# Patient Record
Sex: Female | Born: 1955 | Race: White | Hispanic: No | Marital: Married
Health system: Southern US, Community
[De-identification: ages and names within clinical notes are randomized; demographics above are authoritative.]

## PROBLEM LIST (undated history)

## (undated) DIAGNOSIS — J439 Emphysema, unspecified: Secondary | ICD-10-CM

## (undated) DIAGNOSIS — F419 Anxiety disorder, unspecified: Secondary | ICD-10-CM

## (undated) DIAGNOSIS — C801 Malignant (primary) neoplasm, unspecified: Secondary | ICD-10-CM

## (undated) DIAGNOSIS — E039 Hypothyroidism, unspecified: Secondary | ICD-10-CM

## (undated) DIAGNOSIS — J45909 Unspecified asthma, uncomplicated: Secondary | ICD-10-CM

## (undated) DIAGNOSIS — M1990 Unspecified osteoarthritis, unspecified site: Secondary | ICD-10-CM

## (undated) DIAGNOSIS — F32A Depression, unspecified: Secondary | ICD-10-CM

## (undated) DIAGNOSIS — R0600 Dyspnea, unspecified: Secondary | ICD-10-CM

## (undated) DIAGNOSIS — J449 Chronic obstructive pulmonary disease, unspecified: Secondary | ICD-10-CM

## (undated) HISTORY — PX: COLONOSCOPY: SHX174

## (undated) HISTORY — PX: BACK SURGERY: SHX140

## (undated) HISTORY — PX: CHOLECYSTECTOMY: SHX55

## (undated) HISTORY — PX: BREAST BIOPSY: SHX20

---

## 2000-12-27 ENCOUNTER — Other Ambulatory Visit: Admission: RE | Admit: 2000-12-27 | Discharge: 2000-12-27 | Payer: Self-pay | Admitting: Family Medicine

## 2000-12-27 ENCOUNTER — Ambulatory Visit (HOSPITAL_COMMUNITY): Admission: RE | Admit: 2000-12-27 | Discharge: 2000-12-27 | Payer: Self-pay | Admitting: Family Medicine

## 2000-12-27 ENCOUNTER — Encounter: Payer: Self-pay | Admitting: Family Medicine

## 2002-11-08 ENCOUNTER — Ambulatory Visit (HOSPITAL_COMMUNITY): Admission: RE | Admit: 2002-11-08 | Discharge: 2002-11-08 | Payer: Self-pay | Admitting: Family Medicine

## 2002-11-08 ENCOUNTER — Encounter: Payer: Self-pay | Admitting: Family Medicine

## 2004-04-29 ENCOUNTER — Ambulatory Visit (HOSPITAL_COMMUNITY): Admission: RE | Admit: 2004-04-29 | Discharge: 2004-04-29 | Payer: Self-pay | Admitting: Family Medicine

## 2005-09-07 ENCOUNTER — Ambulatory Visit (HOSPITAL_COMMUNITY): Admission: RE | Admit: 2005-09-07 | Discharge: 2005-09-07 | Payer: Self-pay | Admitting: Family Medicine

## 2007-11-08 ENCOUNTER — Ambulatory Visit (HOSPITAL_COMMUNITY): Admission: RE | Admit: 2007-11-08 | Discharge: 2007-11-08 | Payer: Self-pay | Admitting: Family Medicine

## 2007-12-31 ENCOUNTER — Inpatient Hospital Stay (HOSPITAL_COMMUNITY): Admission: EM | Admit: 2007-12-31 | Discharge: 2008-01-03 | Payer: Self-pay | Admitting: Emergency Medicine

## 2008-12-17 ENCOUNTER — Ambulatory Visit (HOSPITAL_COMMUNITY): Admission: RE | Admit: 2008-12-17 | Discharge: 2008-12-17 | Payer: Self-pay | Admitting: Family Medicine

## 2010-12-22 NOTE — Discharge Summary (Signed)
NAME:  [name] [name], [name] [name]              ACCOUNT NO.:  [id number]   MEDICAL RECORD NO.:  [id number]          PATIENT TYPE:  INP   LOCATION:  A338                          FACILITY:  [facility]   PHYSICIAN:  [name] Singh, DO    DATE OF BIRTH:  [birth year]   DATE OF ADMISSION:  12/31/2007  DATE OF DISCHARGE:  05/27/2009LH                               DISCHARGE SUMMARY   PRIMARY CARE PHYSICIAN:  [name] [name], M.D.   ADMISSION DIAGNOSES:  1. Acute exacerbation of chronic obstructive pulmonary disease.  2. Hypoxic respiratory failure.   DISCHARGE DIAGNOSES:  1. Acute exacerbation of chronic obstructive pulmonary disease, which      has resolved.  2. Bronchitis.  3. Hypothyroidism.   Her H and P was done by Dr. Sherle [name].  Please refer to that, but to  summarize, patient is a 55 year old woman, who presented to [facility]  Emergency Room with the chief complaint of shortness of breath over the  last two to three days.  She was admitted to the service of [facility]  and she was continued on Solu-Medrol IV every 6, as well as Levaquin.  She was placed on GI and DVT prophylaxis.  While she was here, a D-dimer  was also obtained, which ended up being elevated.  This was continued to  be followed.  There was no CT that was done.  However, it was determined  that, even though her D-dimer was elevated, it was of unclear  significance with a low probability of its being a PE.  Also, she was  currently on HCTZ, but due to her blood pressure being low, that was  discontinued while she was here.  Patient continued to improve  significantly and it was determined on 05/26, that she would be  discharged in the morning, as long as her CBC was improving.  Today, her  discharge labs, are as follows.   Her vitals today are within normal limits.  However, there is mentioned  tachycardia, which is possibly due to her nebulizer treatments that she  has been receiving around the clock.  Her CBC:  White count  is 18.6,  which is down from 26.3.  Her hemoglobin is 11.5, hematocrit is 33.5 and  her platelet count is 395.  Her sodium is 139, potassium 3.6, chloride  103, CO2 24, glucose 109, BUN 21 and creatinine 1.21.   CONDITION:  Stable.   DISPOSITION:  Home.  She will be placed on her home meds, which include:   1. Prozac 40 mg p.o. daily.  2. Cytomel 25 mcg daily.  3. Synthroid 0.50 mcg p.o. daily.  4. Aldactazide 25 mg p.o. daily.  5. Simvastatin 20 mg p.o. q.h.s.  6. Xanax 0.5 mg every 6 hours as needed.  7. Ventolin MDI as needed every 4-6 hours.  8. We will give her a prescription for Advair 250/50 use every 12.  9. Medrol Dosepak use as directed.  10.Levaquin 500 mg p.o. daily times 5 days.  11.Xopenex 1.25 mg nebs every 4 hours p.r.n.   We will set patient up with a hand nebulizer and  we will have her  increase activity slowly to have no dietary restrictions, to see her  primary care physician, Dr. Regino [name], in one to three days, initial  appointment.  We will have home health come give her the nebulizer and  she will see her new medications as listed.      [name] Singh, DO  Electronically Signed     CB/MEDQ  D:  01/03/2008  T:  01/03/2008  Job:  [id number]   cc:   [name] [name], M.D.  Fax: [phone]

## 2010-12-22 NOTE — H&P (Signed)
NAME:  [name] [name]              ACCOUNT NO.:  [id number]   MEDICAL RECORD NO.:  [id number]          PATIENT TYPE:  INP   LOCATION:  A338                          FACILITY:  [facility]   PHYSICIAN:  Margaretmary Dys, M.D.DATE OF BIRTH:  1955-11-02   DATE OF ADMISSION:  12/31/2007  DATE OF DISCHARGE:  LH                              HISTORY & PHYSICAL   PRIMARY CARE PHYSICIAN:  Dr. Regino Schultze.   CHIEF COMPLAINT:  Increasing shortness of breath over the last 2 to 3  days.   HISTORY OF PRESENT ILLNESS:  [name] [name] is a 55 year old female who  has a history of chronic obstructive pulmonary disease.  Patient reports  feeling fairly well until about 2 to 3 days ago when she developed some  cough productive of mostly whitish sputum, some shortness of breath and  headaches.   Patient reports that there are a couple of people who are sick at her  workplace and she suspects that she may have picked up a virus from  them.  She denies any fevers or chills, no headaches, no nausea or  vomiting, no abdominal pain.  Patient reports occasional wheezing.  Patient was evaluated in the emergency room and although improved  slightly with nebulization treatment and Solu-Medrol patient continued  to wheeze and also was hypoxic.  Her oxygen saturation was only 86% on  room air.  As a result, patient was admitted for further evaluation and  management.  Currently, patient still feels short of breath but feels  slightly better, she denies any chest pain.  Patient has received  multiple nebulization treatments.  I think patient will benefit from  BiPAP therapy.   REVIEW OF SYSTEMS:  Her 10-point review of systems otherwise negative  except as mentioned in the History of Present Illness above.   PAST MEDICAL HISTORY:  1. Chronic obstructive pulmonary disease.  2. Depression.  3. Hypothyroidism.  4. Status post cholecystectomy.  5. Recent cardiac stress test which patient is yet to get the results  of.  6. Prior history of nicotine abuse, patient quit in February of 2009.   ALLERGIES:  She has no known drug allergies.   MEDICATIONS:  A full list is pending, but patient reports that she is  on:  1. Prozac once a day.  2. Synthroid once a day.  3. Xanax at night.  4. Advair Diskus 250/50, one puff inhaled b.i.d.  5. Atrovent two puffs inhalation q.i.d.   FAMILY HISTORY:  Positive for prostate cancer in father and diabetes in  the father.  Father died at age 24 from complications of diabetes and  renal failure.  Her mother is 69 years old and has some breathing  problems.  She is not exactly sure what her diagnosis is.  She has 4  other siblings who are doing fairly well.   SOCIAL HISTORY:  Patient is married, has 1 daughter who is 53 years old;  she currently works in Clinical research associate.  She quit smoking, after close to a  30-pack-year history of smoking, in February of 2009.  This was her  third attempt at  quitting.  She denies any illicit drug use.  She does  drink an occasional beer.   PHYSICAL EXAM:  Patient was conscious, alert, in mild respiratory  distress.  VITAL SIGNS:  Blood pressure was 118/72 with a pulse of 100, respiration  is 26, temperature 99.1 degrees Fahrenheit, oxygen saturation was 86% on  room air.  However, this has since improved to 97% on 4L oxygen.  HEENT EXAM:  Normocephalic, atraumatic, oral mucosa was moist with no  exudates.  NECK:  Supple, no JVD, no lymphadenopathy.  LUNGS:  Reduced air entry bilaterally with occasional bilateral wheezing  and rhonchi.  ABDOMEN:  Soft, nontender, bowel sounds positive, no masses palpable.  EXTREMITIES:  No pitting, pedal edema, no calf induration or tenderness  was noted.  Patient had multiple tattoos on her lower extremities and  back.  CNS EXAM:  Grossly intact, no focal neurological deficits.   LABORATORY/DIAGNOSTIC DATA:  Blood gas showed a pH of 7.375, pCO2 was  33.4, pO2 was 62.6, his oxygen saturation was  89.7%.  White blood cell  count was 11.8, hemoglobin of 12.7, hematocrit 37.7, platelet count was  389 with 96% neutrophils.  Sodium is 137, potassium 3.4, chloride of  102, CO2 was 23, glucose of 203, BUN of 17, creatinine was 1.18, calcium  was 10.1.  I have requested for a D-dimer and results of which are  pending.  Chest x-ray was clear with no acute infiltrates, the lungs may  be slightly hyperinflated.   ASSESSMENT AND PLAN:  1. Acute chronic obstructive pulmonary disease exacerbation.  2. Hypoxic respiratory failure.   PLAN:  1. Patient will be admitted to the medical floor.  I will continue      therapy with Solu-Medrol 60 mg IV q.6.  2. Patient will be continued on Levaquin 500 mg IV once a day.  3. GI prophylaxis with Protonix, DVT prophylaxis with Lovenox.  4. Resume all her home medications.  5. Due to patient's fair amount of hypoxia and no prior history of      oxygen requirement for her COPD and also the fact that this appears      to be quite acute, will request a D-dimer to rule out the      possibility of pulmonary embolus on her, although I still feel that      the COPD exacerbation is more likely at this time.   DISPOSITION:  I have discussed the above plan with the patient, she  verbalized full understanding.  The patient will also be maintained on  nebulizations around the clock, I will also put her on some BiPAP  therapy in order to help her work of breathing.   CODE STATUS:  Patient is a Full Code.      Margaretmary Dys, M.D.  Electronically Signed     AM/MEDQ  D:  12/31/2007  T:  12/31/2007  Job:  [id number]   cc:   Kirk Ruths, M.D.  Fax: [phone]

## 2010-12-22 NOTE — Group Therapy Note (Signed)
NAME:  [name] [name], [name] [name]              ACCOUNT NO.:  [id number]   MEDICAL RECORD NO.:  [id number]          PATIENT TYPE:  INP   LOCATION:  A338                          FACILITY:  [facility]   PHYSICIAN:  Osvaldo Shipper, MD     DATE OF BIRTH:  January 13, 1956   DATE OF PROCEDURE:  DATE OF DISCHARGE:                                 PROGRESS NOTE   SUBJECTIVE:  The patient is feeling much better.  She continues to have  some wheezing and cough, but she is much improved compared to yesterday.   OBJECTIVE:  VITAL SIGNS:  She has been afebrile, heart rate 120,  respiratory rate 18, blood pressure 101/61, saturation 93% on room air.  LUNGS:  Revealed end-expiratory wheezing bilaterally much reduced  compared to yesterday, improved air entry as well as well as S1, S2,  tachycardiac, regular.  ABDOMEN:  Soft.   LABORATORY DATA:  Labs today show that her white count is 26,300,  slightly up from yesterday.  Hemoglobin is 11.2, platelet count is 368.   ASSESSMENT/PLAN:  Acute COPD exacerbation.  We will reduce her nebulizer  treatments.  Will change her antibiotics to p.o.  We will also change  over her steroids to p.o.  She was also hypoxic when she presented.  The  hypoxia seems to have improved as well.  She had elevated D-dimer, the  significance of which was unclear.  She had low clinical probability for  PE.  Tachycardia secondary to that the treatment.  Leukocytosis likely  secondary to steroids.  Hyperglycemia also secondary to steroids.  She  is on DVT prophylaxis.  She has both history of hypothyroidism and  depression both of which are quite stable.  She has a history of low  blood pressure as well for which she is on HCTZ.  Since the pressures  are running a little bit low here, I am going to go ahead and  discontinue the HCTZ for now.   I think the patient is improved significantly.  She is getting close to  discharge.  We will also discontinue BiPAP use.  I think she probably  will be able  to go home tomorrow.      Osvaldo Shipper, MD  Electronically Signed     GK/MEDQ  D:  01/02/2008  T:  01/02/2008  Job:  [id number]

## 2011-05-05 LAB — DIFFERENTIAL
Basophils Absolute: 0
Basophils Absolute: 0
Basophils Absolute: 0
Basophils Absolute: 0
Basophils Relative: 0
Basophils Relative: 0
Basophils Relative: 0
Basophils Relative: 0
Eosinophils Absolute: 0
Eosinophils Absolute: 0
Eosinophils Absolute: 0
Eosinophils Absolute: 0
Eosinophils Relative: 0
Eosinophils Relative: 0
Eosinophils Relative: 0
Eosinophils Relative: 0
Lymphocytes Relative: 3 — ABNORMAL LOW
Lymphocytes Relative: 4 — ABNORMAL LOW
Lymphocytes Relative: 4 — ABNORMAL LOW
Lymphocytes Relative: 8 — ABNORMAL LOW
Lymphs Abs: 0.4 — ABNORMAL LOW
Lymphs Abs: 0.7
Lymphs Abs: 1
Lymphs Abs: 1.4
Monocytes Absolute: 0 — ABNORMAL LOW
Monocytes Absolute: 0.2
Monocytes Absolute: 0.5
Monocytes Absolute: 0.5
Monocytes Relative: 0 — ABNORMAL LOW
Monocytes Relative: 1 — ABNORMAL LOW
Monocytes Relative: 2 — ABNORMAL LOW
Monocytes Relative: 3
Neutro Abs: 11.4 — ABNORMAL HIGH
Neutro Abs: 16.7 — ABNORMAL HIGH
Neutro Abs: 21.5 — ABNORMAL HIGH
Neutro Abs: 24.8 — ABNORMAL HIGH
Neutrophils Relative %: 90 — ABNORMAL HIGH
Neutrophils Relative %: 94 — ABNORMAL HIGH
Neutrophils Relative %: 96 — ABNORMAL HIGH
Neutrophils Relative %: 96 — ABNORMAL HIGH

## 2011-05-05 LAB — BLOOD GAS, ARTERIAL
Acid-base deficit: 1
Acid-base deficit: 5.2 — ABNORMAL HIGH
Bicarbonate: 19.1 — ABNORMAL LOW
Bicarbonate: 23.1
Delivery systems: POSITIVE
FIO2: 0.21
O2 Content: 3
O2 Saturation: 89.7
O2 Saturation: 91.1
PEEP: 5
Patient temperature: 37
Patient temperature: 37
Pressure support: 10
TCO2: 17.2
TCO2: 21
pCO2 arterial: 33.4 — ABNORMAL LOW
pCO2 arterial: 37.8
pH, Arterial: 7.375
pH, Arterial: 7.404 — ABNORMAL HIGH
pO2, Arterial: 62.6 — ABNORMAL LOW
pO2, Arterial: 64.9 — ABNORMAL LOW

## 2011-05-05 LAB — BASIC METABOLIC PANEL WITH GFR
BUN: 17
CO2: 23
CO2: 23
CO2: 24
Calcium: 10.1
Calcium: 10.4
Calcium: 9.3
Creatinine, Ser: 1.18
GFR calc Af Amer: 57 — ABNORMAL LOW
GFR calc Af Amer: >60
GFR calc non Af Amer: 47 — ABNORMAL LOW
GFR calc non Af Amer: 48 — ABNORMAL LOW
Glucose, Bld: 119 — ABNORMAL HIGH
Glucose, Bld: 203 — ABNORMAL HIGH
Potassium: 3.6
Sodium: 139
Sodium: 140

## 2011-05-05 LAB — CBC
HCT: 32.3 — ABNORMAL LOW
HCT: 33.5 — ABNORMAL LOW
HCT: 35.5 — ABNORMAL LOW
HCT: 37.7
Hemoglobin: 11.2 — ABNORMAL LOW
Hemoglobin: 11.5 — ABNORMAL LOW
Hemoglobin: 12.2
Hemoglobin: 12.7
MCHC: 33.7
MCHC: 34.4
MCHC: 34.4
MCHC: 34.7
MCV: 86.6
MCV: 87
MCV: 87.1
MCV: 88
Platelets: 368
Platelets: 368
Platelets: 389
Platelets: 395
RBC: 3.73 — ABNORMAL LOW
RBC: 3.85 — ABNORMAL LOW
RBC: 4.07
RBC: 4.28
RDW: 12.7
RDW: 12.9
RDW: 13
RDW: 13.1
WBC: 11.8 — ABNORMAL HIGH
WBC: 18.6 — ABNORMAL HIGH
WBC: 22.5 — ABNORMAL HIGH
WBC: 26.3 — ABNORMAL HIGH

## 2011-05-05 LAB — BASIC METABOLIC PANEL
BUN: 20
BUN: 21
Chloride: 102
Chloride: 103
Chloride: 106
Creatinine, Ser: 1.09
Creatinine, Ser: 1.21 — ABNORMAL HIGH
GFR calc Af Amer: 58 — ABNORMAL LOW
GFR calc non Af Amer: 53 — ABNORMAL LOW
Glucose, Bld: 161 — ABNORMAL HIGH
Potassium: 3.4 — ABNORMAL LOW
Potassium: 4.2
Sodium: 137

## 2011-05-05 LAB — D-DIMER, QUANTITATIVE: D-Dimer, Quant: 0.77 — ABNORMAL HIGH

## 2012-08-04 ENCOUNTER — Other Ambulatory Visit (HOSPITAL_COMMUNITY): Payer: Self-pay | Admitting: Family Medicine

## 2012-08-04 DIAGNOSIS — Z139 Encounter for screening, unspecified: Secondary | ICD-10-CM

## 2012-08-10 ENCOUNTER — Ambulatory Visit (HOSPITAL_COMMUNITY): Payer: Self-pay

## 2014-07-07 ENCOUNTER — Emergency Department (HOSPITAL_COMMUNITY): Payer: BC Managed Care – PPO

## 2014-07-07 ENCOUNTER — Emergency Department (HOSPITAL_COMMUNITY)
Admission: EM | Admit: 2014-07-07 | Discharge: 2014-07-08 | Disposition: A | Discharge status: Home / Self Care | Payer: BC Managed Care – PPO | Attending: Emergency Medicine | Admitting: Emergency Medicine

## 2014-07-07 ENCOUNTER — Encounter (HOSPITAL_COMMUNITY): Payer: Self-pay

## 2014-07-07 DIAGNOSIS — Z7951 Long term (current) use of inhaled steroids: Secondary | ICD-10-CM | POA: Insufficient documentation

## 2014-07-07 DIAGNOSIS — J441 Chronic obstructive pulmonary disease with (acute) exacerbation: Secondary | ICD-10-CM | POA: Insufficient documentation

## 2014-07-07 DIAGNOSIS — Z79899 Other long term (current) drug therapy: Secondary | ICD-10-CM | POA: Insufficient documentation

## 2014-07-07 DIAGNOSIS — R0600 Dyspnea, unspecified: Secondary | ICD-10-CM

## 2014-07-07 DIAGNOSIS — R0602 Shortness of breath: Secondary | ICD-10-CM | POA: Diagnosis present

## 2014-07-07 NOTE — ED Notes (Signed)
Patient began experiencing SOB on Wednesday with worsening today. Patient took 1 neb at home. In route patient received two neb treatments and 125 mg IV Solumedrol. Patient currently still receiving second treatment, mild increased WOB, dyspnea on exertion HR 124, RR 25, SPO2 100% on RA and BP 118/77

## 2014-07-07 NOTE — ED Provider Notes (Addendum)
CSN: [id number]     Arrival date & time 07/07/14  2141 History  This chart was scribed for [name] Melter, MD by Murriel Hopper, ED Scribe. This patient was seen in room APA07/APA07 and the patient's care was started at 11:46 PM.    Chief Complaint  Patient presents with  . Shortness of Breath    The history is provided by the patient. No language interpreter was used.    HPI Comments: [name] [name] is a 58 y.o. female brought into ED by EMS treated with nebulizer and solumedrol with a history of COPD, asthma, and emphysema who presents to the Emergency Department complaining of intermittent, worsening SOB that began four days ago. Pt states that she had an episode of SOB tonight when she received bad news from her uncle. Pt states that she feels like she started hyperventilating and felt that she could not breathe. Pt uses nebulizer treatments. Pt states that most of the time she lives in [place] at [employer], and only experiences difficulty breathing when she comes here to see her family. Pt also notes that she fell a month ago while working and landed on her back, and states that she fractured her L3 vertebrae. Pt denies chest pain, fever, vomiting, or diarrhea.   No past medical history on file. No past surgical history on file. No family history on file. History  Substance Use Topics  . Smoking status: Not on file  . Smokeless tobacco: Not on file  . Alcohol Use: No   OB History    No data available     Review of Systems  Constitutional: Negative for fever.  Respiratory: Positive for shortness of breath.   Cardiovascular: Negative for chest pain.  Gastrointestinal: Negative for vomiting and diarrhea.  All other systems reviewed and are negative.     Allergies  Review of patient's allergies indicates no known allergies.  Home Medications   Prior to Admission medications   Medication Sig Start Date End Date Taking? Authorizing Provider  albuterol (PROAIR HFA) 108  (90 BASE) MCG/ACT inhaler Inhale 2 puffs into the lungs every 6 (six) hours as needed for wheezing or shortness of breath.   Yes Historical Provider, MD  ALPRAZolam Prudy Feeler) 1 MG tablet Take 1-2 mg by mouth at bedtime as needed for anxiety or sleep.   Yes Historical Provider, MD  diazepam (VALIUM) 5 MG tablet Take 5 mg by mouth every 6 (six) hours as needed for anxiety.   Yes Historical Provider, MD  FLUoxetine (PROZAC) 20 MG capsule Take 40 mg by mouth daily.   Yes Historical Provider, MD  ipratropium-albuterol (DUONEB) 0.5-2.5 (3) MG/3ML SOLN Take 3 mLs by nebulization every 6 (six) hours as needed (shortness of breath-wheezing).   Yes Historical Provider, MD  levothyroxine (SYNTHROID, LEVOTHROID) 88 MCG tablet Take 88 mcg by mouth every morning.   Yes Historical Provider, MD  montelukast (SINGULAIR) 10 MG tablet Take 10 mg by mouth daily.   Yes Historical Provider, MD  oxyCODONE-acetaminophen (PERCOCET/ROXICET) 5-325 MG per tablet Take 1 tablet by mouth every 4 (four) hours as needed for severe pain.   Yes Historical Provider, MD  tiotropium (SPIRIVA) 18 MCG inhalation capsule Place 18 mcg into inhaler and inhale daily.   Yes Historical Provider, MD   BP 118/77 mmHg  Pulse 125  Temp(Src) 98.2 F (36.8 C)  Resp 25  Ht 5' 8.5" (1.74 m)  Wt 170 lb (77.111 kg)  BMI 25.47 kg/m2  SpO2 100% Physical Exam  Constitutional: She is oriented to person, place, and time. She appears well-developed and well-nourished.  HENT:  Head: Normocephalic and atraumatic.  Eyes: Conjunctivae and EOM are normal. Pupils are equal, round, and reactive to light.  Neck: Normal range of motion and phonation normal. Neck supple.  Cardiovascular: Normal rate and regular rhythm.   Pulmonary/Chest: Effort normal. She has wheezes. She exhibits no tenderness.  Decreased airflow bilaterally Scattered wheezes   Abdominal: Soft. She exhibits no distension. There is no tenderness. There is no guarding.  Musculoskeletal:  Normal range of motion.  Neurological: She is alert and oriented to person, place, and time. She exhibits normal muscle tone.  Skin: Skin is warm and dry.  Psychiatric: She has a normal mood and affect. Her behavior is normal. Judgment and thought content normal.  Nursing note and vitals reviewed.   ED Course  Procedures (including critical care time)  DIAGNOSTIC STUDIES: Oxygen Saturation is 100% on [place], normal by my interpretation.    COORDINATION OF CARE: 11:58 PM Discussed treatment plan with pt at bedside and pt agreed to plan.  Medications  albuterol (PROVENTIL,VENTOLIN) solution continuous neb (not administered)  albuterol (PROVENTIL,VENTOLIN) solution continuous neb (10 mg/hr Nebulization Given 07/08/14 0006)    Patient Vitals for the past 24 hrs:  BP Temp Pulse Resp SpO2 Height Weight  07/08/14 0200 106/67 mmHg - 115 24 92 % - -  07/08/14 0145 - - 114 15 94 % - -  07/08/14 0130 112/69 mmHg - 109 18 (!) 88 % - -  07/08/14 0115 - - 105 22 100 % - -  07/08/14 0100 117/76 mmHg - 110 21 97 % - -  07/08/14 0030 112/78 mmHg - 109 17 99 % - -  07/08/14 0015 - - 104 10 98 % - -  07/08/14 0012 - - - - 96 % - -  07/07/14 2330 100/73 mmHg - - - - - -  07/07/14 2300 103/73 mmHg - 105 - 95 % - -  07/07/14 2245 - - 111 - 92 % - -  07/07/14 2230 103/69 mmHg - 112 - 94 % - -  07/07/14 2215 - - 113 - 91 % - -  07/07/14 2200 108/72 mmHg - 117 - 99 % - -  07/07/14 2145 118/77 mmHg 98.2 F (36.8 C) (!) 125 25 100 % 5' 8.5" (1.74 m) 170 lb (77.111 kg)    2:27 AM Reevaluation with update and discussion. After initial assessment and treatment, an updated evaluation reveals the patient states her cough is "breaking up".  When she ambulated.  Her oxygen saturation dropped to 86%, while on room air.  Second continuous albuterol nebulizer is ordered.Mancel Bale. Erienne Spelman L   4:26 AM Reevaluation with update and discussion. After initial assessment and treatment, an updated evaluation reveals she  feels better, and is at her baseline.  She is not able to ambulate on room and with oxygen saturation between 92-94%.  Findings discussed with patient, all questions answered. Mykeal Carrick L    CRITICAL CARE Performed by: Mancel BaleWENTZ,Glanda Spanbauer L Total critical care time: 35 minutes Critical care time was exclusive of separately billable procedures and treating other patients. Critical care was necessary to treat or prevent imminent or life-threatening deterioration. Critical care was time spent personally by me on the following activities: development of treatment plan with patient and/or surrogate as well as nursing, discussions with consultants, evaluation of patient's response to treatment, examination of patient, obtaining history from patient or surrogate, ordering and performing treatments and  interventions, ordering and review of laboratory studies, ordering and review of radiographic studies, pulse oximetry and re-evaluation of patient's condition.  Labs Review Labs Reviewed  CBC WITH DIFFERENTIAL  BASIC METABOLIC PANEL  TROPONIN I    Imaging Review No results found.   EKG Interpretation   Date/Time:  Monday July 08 2014 00:05:50 EST Ventricular Rate:  104 PR Interval:  134 QRS Duration: 88 QT Interval:  348 QTC Calculation: 458 R Axis:   83 Text Interpretation:  Sinus tachycardia Atrial premature complex Minimal  ST depression, diffuse leads Baseline wander in lead(s) V3 No old tracing  to compare Confirmed by [facility] Hospital[facility]ENTZ  MD, Keonta Alsip [phone]([id number] on 07/08/2014 4:49:23  AM      MDM   Final diagnoses:  COPD exacerbation     COPD exacerbation, improved after aggressive treatment in the emergency department.  Doubt pneumonia, ACS or PE.  Nursing Notes Reviewed/ Care Coordinated Applicable Imaging Reviewed Interpretation of Laboratory Data incorporated into ED treatment  The patient appears reasonably screened and/or stabilized for discharge and I doubt any other medical  condition or other [facility] requiring further screening, evaluation, or treatment in the ED at this time prior to discharge.  Plan: Home Medications- Avelox, Prednisone; Home Treatments- rest; return here if the recommended treatment, does not improve the symptoms; Recommended follow up- PCP prn  I personally performed the services described in this documentation, which was scribed in my presence. The recorded information has been reviewed and is accurate.      [name] MelterElliott L Jasminemarie Sherrard, MD 07/08/14 [id number]  [name] MelterElliott L Pari Lombard, MD 07/08/14 [phone]

## 2014-07-08 LAB — BASIC METABOLIC PANEL WITH GFR
Anion gap: 12 (ref 5–15)
BUN: 15 mg/dL (ref 6–23)
CO2: 27 meq/L (ref 19–32)
Calcium: 9.6 mg/dL (ref 8.4–10.5)
Chloride: 104 meq/L (ref 96–112)
Creatinine, Ser: 1.04 mg/dL (ref 0.50–1.10)
GFR calc Af Amer: 67 mL/min — ABNORMAL LOW (ref >90)
GFR calc non Af Amer: 58 mL/min — ABNORMAL LOW (ref >90)
Glucose, Bld: 131 mg/dL — ABNORMAL HIGH (ref 70–99)
Potassium: 4.1 meq/L (ref 3.7–5.3)
Sodium: 143 meq/L (ref 137–147)

## 2014-07-08 LAB — CBC WITH DIFFERENTIAL/PLATELET
Basophils Absolute: 0 10*3/uL (ref 0.0–0.1)
Basophils Relative: 0 % (ref 0–1)
Eosinophils Absolute: 0.2 10*3/uL (ref 0.0–0.7)
Eosinophils Relative: 1 % (ref 0–5)
HCT: 35.6 % — ABNORMAL LOW (ref 36.0–46.0)
Hemoglobin: 11.5 g/dL — ABNORMAL LOW (ref 12.0–15.0)
Lymphocytes Relative: 7 % — ABNORMAL LOW (ref 12–46)
Lymphs Abs: 0.8 10*3/uL (ref 0.7–4.0)
MCH: 29 pg (ref 26.0–34.0)
MCHC: 32.3 g/dL (ref 30.0–36.0)
MCV: 89.9 fL (ref 78.0–100.0)
Monocytes Absolute: 0.2 10*3/uL (ref 0.1–1.0)
Monocytes Relative: 1 % — ABNORMAL LOW (ref 3–12)
Neutro Abs: 10.4 10*3/uL — ABNORMAL HIGH (ref 1.7–7.7)
Neutrophils Relative %: 90 % — ABNORMAL HIGH (ref 43–77)
Platelets: 384 10*3/uL (ref 150–400)
RBC: 3.96 MIL/uL (ref 3.87–5.11)
RDW: 13.3 % (ref 11.5–15.5)
WBC: 11.6 10*3/uL — ABNORMAL HIGH (ref 4.0–10.5)

## 2014-07-08 LAB — TROPONIN I: Troponin I: <0.3 ng/mL (ref <0.30)

## 2014-07-08 MED ORDER — ALBUTEROL (5 MG/ML) CONTINUOUS INHALATION SOLN
10.0000 mg/h | INHALATION_SOLUTION | Freq: Once | RESPIRATORY_TRACT | Status: AC
  Administered 2014-07-08: 10 mg/h via RESPIRATORY_TRACT

## 2014-07-08 MED ORDER — MOXIFLOXACIN HCL 400 MG PO TABS: 400.0000 mg | ORAL_TABLET | Freq: Every day | ORAL | Status: AC

## 2014-07-08 MED ORDER — PREDNISONE 20 MG PO TABS: 20.0000 mg | ORAL_TABLET | Freq: Two times a day (BID) | ORAL | Status: AC

## 2014-07-08 MED ORDER — ALBUTEROL (5 MG/ML) CONTINUOUS INHALATION SOLN
10.0000 mg/h | INHALATION_SOLUTION | Freq: Once | RESPIRATORY_TRACT | Status: AC
  Administered 2014-07-08: 10 mg/h via RESPIRATORY_TRACT
  Filled 2014-07-08: qty 20

## 2014-07-08 NOTE — Discharge Instructions (Signed)

## 2014-07-08 NOTE — Progress Notes (Signed)
Patient given 2nd CAT

## 2014-07-08 NOTE — ED Notes (Signed)
Pt ambulated in hallway with portable pulse ox. Pt's sats ranged anywhere from 86 to 89%. Pt's heart rate was anywhere between 115 and 121

## 2015-10-04 IMAGING — CR DG CHEST 2V
2 series · 2 of 2 positions shown · non-contrast
Comparison: None.

CLINICAL DATA: Acute onset of dyspnea.  Initial encounter.

EXAM:
CHEST  2 VIEW

[view not recorded (1 of 2)]
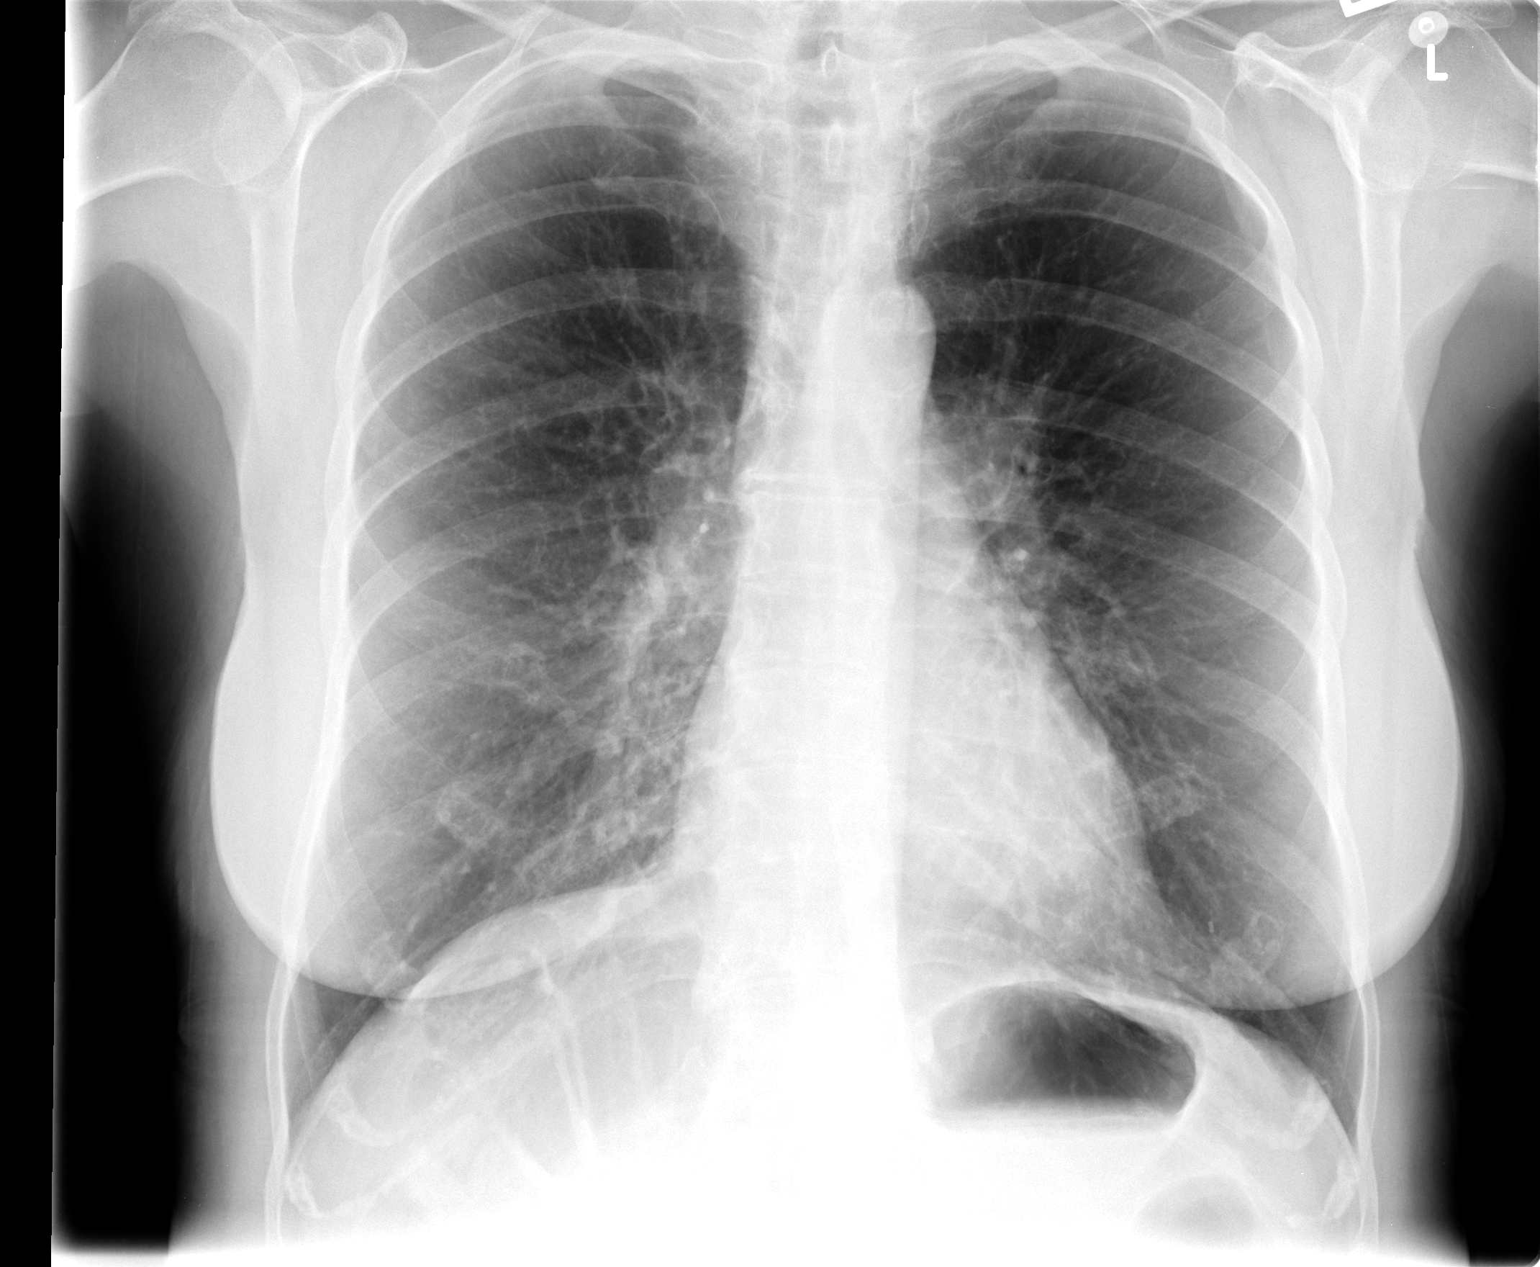

[view not recorded (2 of 2)]
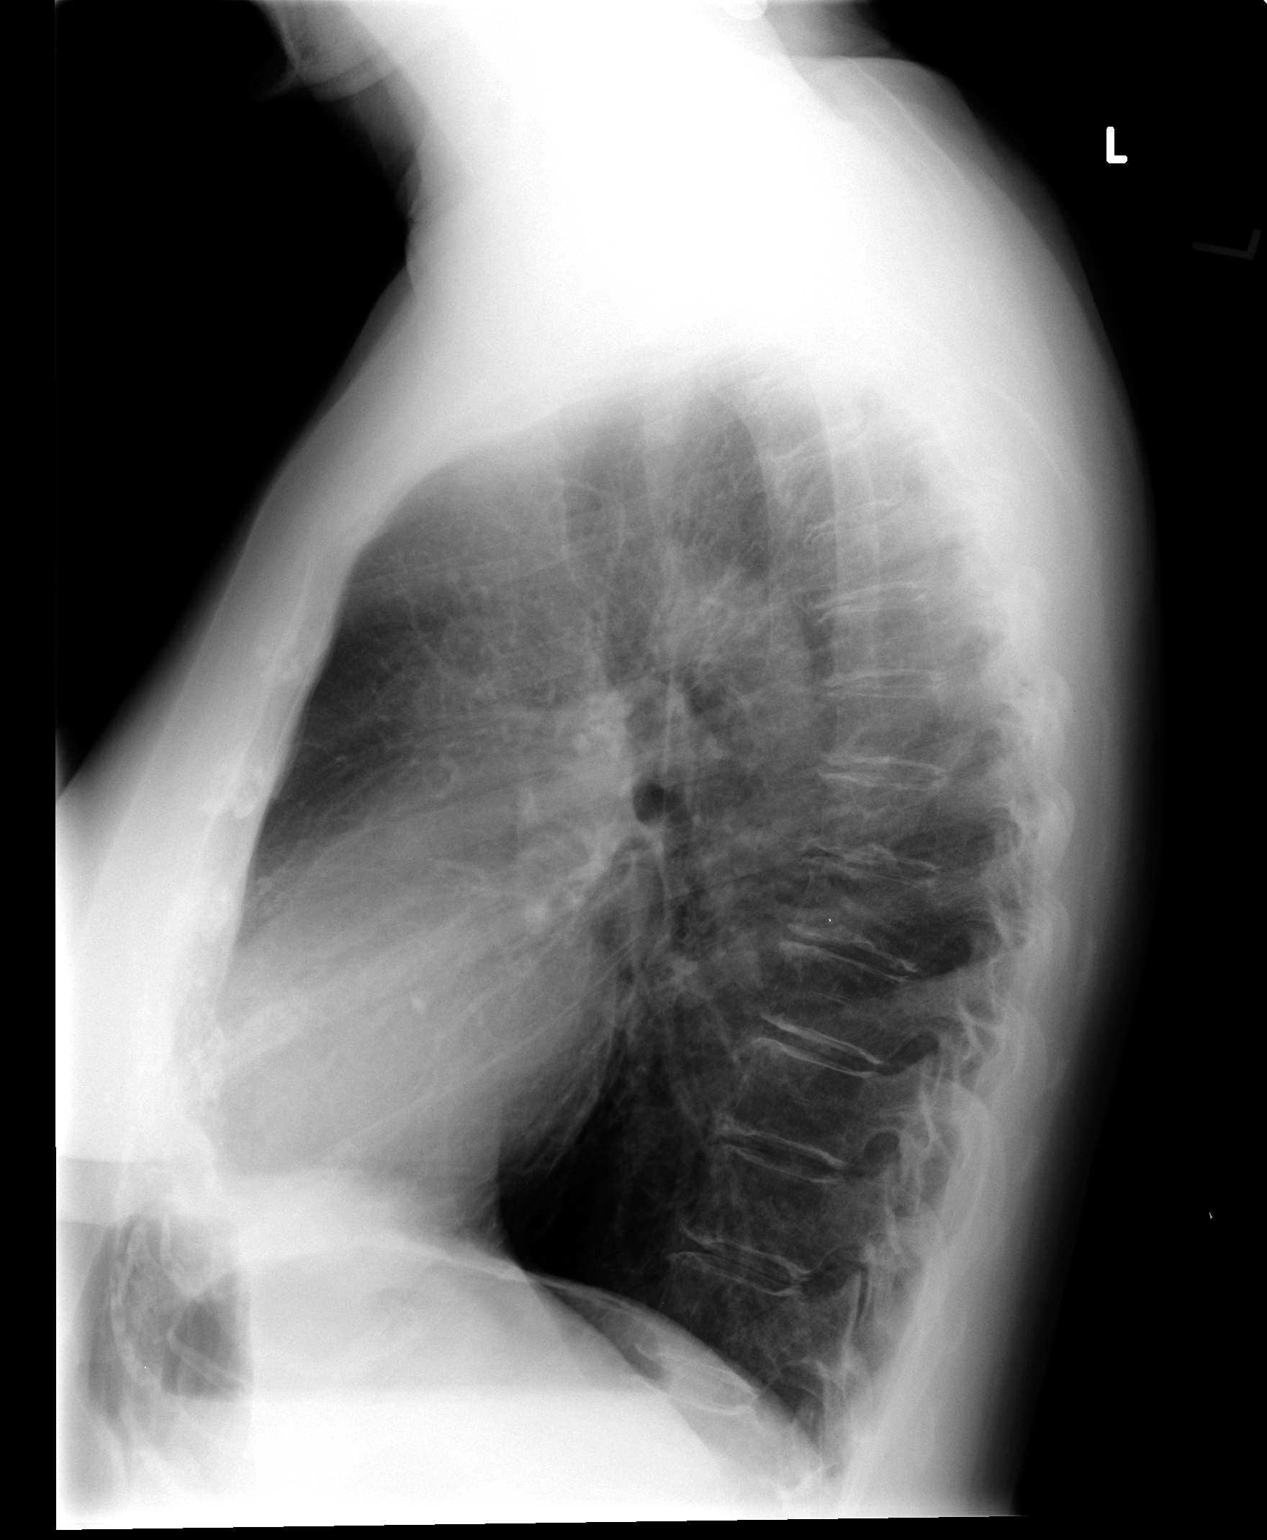

[2 of 2 positions shown; findings below may reference images not displayed]

FINDINGS: The lungs are well-aerated and clear. There is no evidence of focal
opacification, pleural effusion or pneumothorax.

The heart is normal in size; the mediastinal contour is within
normal limits. No acute osseous abnormalities are seen.
IMPRESSION: No acute cardiopulmonary process seen.

## 2022-03-06 LAB — EXTERNAL GENERIC LAB PROCEDURE: COLOGUARD: NEGATIVE

## 2022-03-06 LAB — COLOGUARD: COLOGUARD: NEGATIVE

## 2023-03-10 DIAGNOSIS — F909 Attention-deficit hyperactivity disorder, unspecified type: Secondary | ICD-10-CM | POA: Diagnosis not present

## 2023-03-10 DIAGNOSIS — Z79890 Hormone replacement therapy (postmenopausal): Secondary | ICD-10-CM | POA: Diagnosis not present

## 2023-03-10 DIAGNOSIS — J45909 Unspecified asthma, uncomplicated: Secondary | ICD-10-CM | POA: Diagnosis not present

## 2023-03-10 DIAGNOSIS — F17210 Nicotine dependence, cigarettes, uncomplicated: Secondary | ICD-10-CM | POA: Diagnosis not present

## 2023-03-10 DIAGNOSIS — Z79899 Other long term (current) drug therapy: Secondary | ICD-10-CM | POA: Diagnosis not present

## 2023-03-10 DIAGNOSIS — F32A Depression, unspecified: Secondary | ICD-10-CM | POA: Diagnosis not present

## 2023-03-10 DIAGNOSIS — E039 Hypothyroidism, unspecified: Secondary | ICD-10-CM | POA: Diagnosis not present

## 2023-03-10 DIAGNOSIS — F411 Generalized anxiety disorder: Secondary | ICD-10-CM | POA: Diagnosis not present

## 2023-04-08 DIAGNOSIS — F32A Depression, unspecified: Secondary | ICD-10-CM | POA: Diagnosis not present

## 2023-04-08 DIAGNOSIS — E039 Hypothyroidism, unspecified: Secondary | ICD-10-CM | POA: Diagnosis not present

## 2023-04-14 ENCOUNTER — Other Ambulatory Visit (HOSPITAL_COMMUNITY): Payer: Self-pay | Admitting: Family Medicine

## 2023-04-14 ENCOUNTER — Encounter (HOSPITAL_COMMUNITY): Payer: Self-pay | Admitting: Family Medicine

## 2023-04-14 DIAGNOSIS — F909 Attention-deficit hyperactivity disorder, unspecified type: Secondary | ICD-10-CM | POA: Diagnosis not present

## 2023-04-14 DIAGNOSIS — M170 Bilateral primary osteoarthritis of knee: Secondary | ICD-10-CM | POA: Diagnosis not present

## 2023-04-14 DIAGNOSIS — Z13820 Encounter for screening for osteoporosis: Secondary | ICD-10-CM

## 2023-04-14 DIAGNOSIS — F32A Depression, unspecified: Secondary | ICD-10-CM | POA: Diagnosis not present

## 2023-04-14 DIAGNOSIS — F411 Generalized anxiety disorder: Secondary | ICD-10-CM | POA: Diagnosis not present

## 2023-04-14 DIAGNOSIS — B351 Tinea unguium: Secondary | ICD-10-CM | POA: Diagnosis not present

## 2023-04-14 DIAGNOSIS — N289 Disorder of kidney and ureter, unspecified: Secondary | ICD-10-CM | POA: Diagnosis not present

## 2023-04-14 DIAGNOSIS — E039 Hypothyroidism, unspecified: Secondary | ICD-10-CM | POA: Diagnosis not present

## 2023-04-14 DIAGNOSIS — Z1231 Encounter for screening mammogram for malignant neoplasm of breast: Secondary | ICD-10-CM

## 2023-04-14 DIAGNOSIS — J45909 Unspecified asthma, uncomplicated: Secondary | ICD-10-CM | POA: Diagnosis not present

## 2023-04-14 DIAGNOSIS — L989 Disorder of the skin and subcutaneous tissue, unspecified: Secondary | ICD-10-CM | POA: Diagnosis not present

## 2023-04-21 DIAGNOSIS — X32XXXA Exposure to sunlight, initial encounter: Secondary | ICD-10-CM | POA: Diagnosis not present

## 2023-04-21 DIAGNOSIS — D0472 Carcinoma in situ of skin of left lower limb, including hip: Secondary | ICD-10-CM | POA: Diagnosis not present

## 2023-04-21 DIAGNOSIS — L570 Actinic keratosis: Secondary | ICD-10-CM | POA: Diagnosis not present

## 2023-04-21 DIAGNOSIS — C44529 Squamous cell carcinoma of skin of other part of trunk: Secondary | ICD-10-CM | POA: Diagnosis not present

## 2023-04-21 DIAGNOSIS — C44521 Squamous cell carcinoma of skin of breast: Secondary | ICD-10-CM | POA: Diagnosis not present

## 2023-04-21 DIAGNOSIS — L568 Other specified acute skin changes due to ultraviolet radiation: Secondary | ICD-10-CM | POA: Diagnosis not present

## 2023-04-21 DIAGNOSIS — D485 Neoplasm of uncertain behavior of skin: Secondary | ICD-10-CM | POA: Diagnosis not present

## 2023-04-26 DIAGNOSIS — Z1211 Encounter for screening for malignant neoplasm of colon: Secondary | ICD-10-CM | POA: Diagnosis not present

## 2023-04-27 DIAGNOSIS — M1711 Unilateral primary osteoarthritis, right knee: Secondary | ICD-10-CM | POA: Diagnosis not present

## 2023-05-04 DIAGNOSIS — M1711 Unilateral primary osteoarthritis, right knee: Secondary | ICD-10-CM | POA: Diagnosis not present

## 2023-05-11 DIAGNOSIS — M1711 Unilateral primary osteoarthritis, right knee: Secondary | ICD-10-CM | POA: Diagnosis not present

## 2023-06-13 DIAGNOSIS — D0472 Carcinoma in situ of skin of left lower limb, including hip: Secondary | ICD-10-CM | POA: Diagnosis not present

## 2023-06-13 DIAGNOSIS — X32XXXA Exposure to sunlight, initial encounter: Secondary | ICD-10-CM | POA: Diagnosis not present

## 2023-06-13 DIAGNOSIS — L568 Other specified acute skin changes due to ultraviolet radiation: Secondary | ICD-10-CM | POA: Diagnosis not present

## 2023-06-13 DIAGNOSIS — C44529 Squamous cell carcinoma of skin of other part of trunk: Secondary | ICD-10-CM | POA: Diagnosis not present

## 2023-06-13 DIAGNOSIS — L570 Actinic keratosis: Secondary | ICD-10-CM | POA: Diagnosis not present

## 2023-06-13 DIAGNOSIS — D045 Carcinoma in situ of skin of trunk: Secondary | ICD-10-CM | POA: Diagnosis not present

## 2023-06-13 DIAGNOSIS — D485 Neoplasm of uncertain behavior of skin: Secondary | ICD-10-CM | POA: Diagnosis not present

## 2023-06-22 DIAGNOSIS — M25561 Pain in right knee: Secondary | ICD-10-CM | POA: Diagnosis not present

## 2023-06-23 DIAGNOSIS — D045 Carcinoma in situ of skin of trunk: Secondary | ICD-10-CM | POA: Diagnosis not present

## 2023-06-23 DIAGNOSIS — C44529 Squamous cell carcinoma of skin of other part of trunk: Secondary | ICD-10-CM | POA: Diagnosis not present

## 2023-07-05 DIAGNOSIS — C44729 Squamous cell carcinoma of skin of left lower limb, including hip: Secondary | ICD-10-CM | POA: Diagnosis not present

## 2023-07-05 DIAGNOSIS — C44529 Squamous cell carcinoma of skin of other part of trunk: Secondary | ICD-10-CM | POA: Diagnosis not present

## 2023-07-20 DIAGNOSIS — D0461 Carcinoma in situ of skin of right upper limb, including shoulder: Secondary | ICD-10-CM | POA: Diagnosis not present

## 2023-07-20 DIAGNOSIS — D485 Neoplasm of uncertain behavior of skin: Secondary | ICD-10-CM | POA: Diagnosis not present

## 2023-07-20 DIAGNOSIS — D0472 Carcinoma in situ of skin of left lower limb, including hip: Secondary | ICD-10-CM | POA: Diagnosis not present

## 2023-07-20 DIAGNOSIS — L570 Actinic keratosis: Secondary | ICD-10-CM | POA: Diagnosis not present

## 2023-08-11 DIAGNOSIS — C44622 Squamous cell carcinoma of skin of right upper limb, including shoulder: Secondary | ICD-10-CM | POA: Diagnosis not present

## 2023-08-11 DIAGNOSIS — C44729 Squamous cell carcinoma of skin of left lower limb, including hip: Secondary | ICD-10-CM | POA: Diagnosis not present

## 2023-08-17 DIAGNOSIS — M25562 Pain in left knee: Secondary | ICD-10-CM | POA: Diagnosis not present

## 2023-08-17 DIAGNOSIS — M25561 Pain in right knee: Secondary | ICD-10-CM | POA: Diagnosis not present

## 2023-08-19 DIAGNOSIS — E663 Overweight: Secondary | ICD-10-CM | POA: Diagnosis not present

## 2023-08-19 DIAGNOSIS — J441 Chronic obstructive pulmonary disease with (acute) exacerbation: Secondary | ICD-10-CM | POA: Diagnosis not present

## 2023-08-19 DIAGNOSIS — Z6825 Body mass index (BMI) 25.0-25.9, adult: Secondary | ICD-10-CM | POA: Diagnosis not present

## 2023-08-19 DIAGNOSIS — R059 Cough, unspecified: Secondary | ICD-10-CM | POA: Diagnosis not present

## 2023-09-14 DIAGNOSIS — M25562 Pain in left knee: Secondary | ICD-10-CM | POA: Diagnosis not present

## 2023-09-14 DIAGNOSIS — M25561 Pain in right knee: Secondary | ICD-10-CM | POA: Diagnosis not present

## 2023-10-06 DIAGNOSIS — Z136 Encounter for screening for cardiovascular disorders: Secondary | ICD-10-CM | POA: Diagnosis not present

## 2023-10-06 DIAGNOSIS — Z0000 Encounter for general adult medical examination without abnormal findings: Secondary | ICD-10-CM | POA: Diagnosis not present

## 2023-10-12 DIAGNOSIS — M170 Bilateral primary osteoarthritis of knee: Secondary | ICD-10-CM | POA: Diagnosis not present

## 2023-10-13 ENCOUNTER — Other Ambulatory Visit (HOSPITAL_COMMUNITY): Payer: Self-pay | Admitting: Family Medicine

## 2023-10-13 DIAGNOSIS — E7801 Familial hypercholesterolemia: Secondary | ICD-10-CM | POA: Diagnosis not present

## 2023-10-13 DIAGNOSIS — Z1231 Encounter for screening mammogram for malignant neoplasm of breast: Secondary | ICD-10-CM | POA: Diagnosis not present

## 2023-10-13 DIAGNOSIS — E039 Hypothyroidism, unspecified: Secondary | ICD-10-CM | POA: Diagnosis not present

## 2023-10-13 DIAGNOSIS — Z5320 Procedure and treatment not carried out because of patient's decision for unspecified reasons: Secondary | ICD-10-CM | POA: Diagnosis not present

## 2023-10-13 DIAGNOSIS — F909 Attention-deficit hyperactivity disorder, unspecified type: Secondary | ICD-10-CM | POA: Diagnosis not present

## 2023-10-13 DIAGNOSIS — N183 Chronic kidney disease, stage 3 unspecified: Secondary | ICD-10-CM | POA: Diagnosis not present

## 2023-10-13 DIAGNOSIS — F32A Depression, unspecified: Secondary | ICD-10-CM | POA: Diagnosis not present

## 2023-10-13 DIAGNOSIS — Z6824 Body mass index (BMI) 24.0-24.9, adult: Secondary | ICD-10-CM | POA: Diagnosis not present

## 2023-10-13 DIAGNOSIS — J45909 Unspecified asthma, uncomplicated: Secondary | ICD-10-CM | POA: Diagnosis not present

## 2023-10-13 DIAGNOSIS — L989 Disorder of the skin and subcutaneous tissue, unspecified: Secondary | ICD-10-CM | POA: Diagnosis not present

## 2023-10-13 DIAGNOSIS — M170 Bilateral primary osteoarthritis of knee: Secondary | ICD-10-CM | POA: Diagnosis not present

## 2023-10-13 DIAGNOSIS — F411 Generalized anxiety disorder: Secondary | ICD-10-CM | POA: Diagnosis not present

## 2023-10-28 ENCOUNTER — Ambulatory Visit (HOSPITAL_COMMUNITY): Payer: Self-pay

## 2023-10-28 ENCOUNTER — Ambulatory Visit (HOSPITAL_COMMUNITY)
Admission: RE | Admit: 2023-10-28 | Discharge: 2023-10-28 | Disposition: A | Discharge status: Home / Self Care | Payer: Self-pay | Source: Ambulatory Visit | Attending: Family Medicine | Admitting: Family Medicine

## 2023-10-28 ENCOUNTER — Encounter (HOSPITAL_COMMUNITY): Payer: Self-pay

## 2023-10-28 DIAGNOSIS — Z1231 Encounter for screening mammogram for malignant neoplasm of breast: Secondary | ICD-10-CM | POA: Insufficient documentation

## 2023-11-03 NOTE — Progress Notes (Signed)
 Surgery orders requested via Epic inbox.

## 2023-11-09 ENCOUNTER — Inpatient Hospital Stay
Admission: RE | Admit: 2023-11-09 | Discharge: 2023-11-09 | Disposition: A | Discharge status: Home / Self Care | Payer: Self-pay | Source: Ambulatory Visit | Attending: Family Medicine | Admitting: Family Medicine

## 2023-11-09 ENCOUNTER — Other Ambulatory Visit (HOSPITAL_COMMUNITY): Payer: Self-pay | Admitting: Family Medicine

## 2023-11-09 DIAGNOSIS — Z1231 Encounter for screening mammogram for malignant neoplasm of breast: Secondary | ICD-10-CM

## 2023-11-09 NOTE — Patient Instructions (Signed)
 DUE TO COVID-19 ONLY TWO VISITORS  (aged 68 and older)  ARE ALLOWED TO COME WITH YOU AND STAY IN THE WAITING ROOM ONLY DURING PRE OP AND PROCEDURE.   **NO VISITORS ARE ALLOWED IN THE SHORT STAY AREA OR RECOVERY ROOM!!**  IF YOU WILL BE ADMITTED INTO THE HOSPITAL YOU ARE ALLOWED ONLY FOUR SUPPORT PEOPLE DURING VISITATION HOURS ONLY (7 AM -8PM)   The support person(s) must pass our screening, gel in and out, and wear a mask at all times, including in the patient's room. Patients must also wear a mask when staff or their support person are in the room. Visitors GUEST BADGE MUST BE WORN VISIBLY  One adult visitor may remain with you overnight and MUST be in the room by 8 P.M.     Your procedure is scheduled on: 11/22/23   Report to [facility] Main Entrance    Report to admitting at : 1:00 PM   Call this number if you have problems the morning of surgery [phone]   Do not eat food :After Midnight.   After Midnight you may have the following liquids until : 12:40 PM DAY OF SURGERY  Water Black Coffee (sugar ok, NO MILK/CREAM OR CREAMERS)  Tea (sugar ok, NO MILK/CREAM OR CREAMERS) regular and decaf                             Plain Jell-O (NO RED)                                           Fruit ices (not with fruit pulp, NO RED)                                     Popsicles (NO RED)                                                                  Juice: apple, WHITE grape, WHITE cranberry Sports drinks like Gatorade (NO RED)   The day of surgery:  Drink ONE (1) Pre-Surgery Clear Ensure at : 12:40 PM the morning of surgery. Drink in one sitting. Do not sip.  This drink was given to you during your hospital  pre-op appointment visit. Nothing else to drink after completing the  Pre-Surgery Clear Ensure or G2.          If you have questions, please contact your surgeon's office.  FOLLOW ANY ADDITIONAL PRE OP INSTRUCTIONS YOU RECEIVED FROM YOUR SURGEON'S OFFICE!!!   Oral  Hygiene is also important to reduce your risk of infection.                                    Remember - BRUSH YOUR TEETH THE MORNING OF SURGERY WITH YOUR REGULAR TOOTHPASTE  DENTURES WILL BE REMOVED PRIOR TO SURGERY PLEASE DO NOT APPLY "Poly grip" OR ADHESIVES!!!   Do NOT smoke after Midnight   Take these medicines the morning of surgery  with A SIP OF WATER: fluoxetine,levothyroxine.Use inhalers as usual.  STOP TAKING all Vitamins, Herbs and supplements 1 week before your surgery.                               You may not have any metal on your body including hair pins, jewelry, and body piercing             Do not wear make-up, lotions, powders, perfumes/cologne, or deodorant  Do not wear nail polish including gel and S&S, artificial/acrylic nails, or any other type of covering on natural nails including finger and toenails. If you have artificial nails, gel coating, etc. that needs to be removed by a nail salon please have this removed prior to surgery or surgery may need to be canceled/ delayed if the surgeon/ anesthesia feels like they are unable to be safely monitored.   Do not shave  48 hours prior to surgery.    Do not bring valuables to the hospital. [facility] IS NOT             RESPONSIBLE   FOR VALUABLES.   Contacts, glasses, or bridgework may not be worn into surgery.   Bring small overnight bag day of surgery.   DO NOT BRING YOUR HOME MEDICATIONS TO THE HOSPITAL. PHARMACY WILL DISPENSE MEDICATIONS LISTED ON YOUR MEDICATION LIST TO YOU DURING YOUR ADMISSION IN THE HOSPITAL!    Patients discharged on the day of surgery will not be allowed to drive home.  Someone NEEDS to stay with you for the first 24 hours after anesthesia.   Special Instructions: Bring a copy of your healthcare power of attorney and living will documents         the day of surgery if you haven't scanned them before.              Please read over the following fact sheets you were given: IF YOU HAVE  QUESTIONS ABOUT YOUR PRE-OP INSTRUCTIONS PLEASE CALL [phone]      Pre-operative 5 CHG Bath Instructions   You can play a key role in reducing the risk of infection after surgery. Your skin needs to be as free of germs as possible. You can reduce the number of germs on your skin by washing with CHG (chlorhexidine gluconate) soap before surgery. CHG is an antiseptic soap that kills germs and continues to kill germs even after washing.   DO NOT use if you have an allergy to chlorhexidine/CHG or antibacterial soaps. If your skin becomes reddened or irritated, stop using the CHG and notify one of our RNs at [phone].   Please shower with the CHG soap starting 4 days before surgery using the following schedule:     Please keep in mind the following:  DO NOT shave, including legs and underarms, starting the day of your first shower.   You may shave your face at any point before/day of surgery.  Place clean sheets on your bed the day you start using CHG soap. Use a clean washcloth (not used since being washed) for each shower. DO NOT sleep with pets once you start using the CHG.   CHG Shower Instructions:  If you choose to wash your hair and private area, wash first with your normal shampoo/soap.  After you use shampoo/soap, rinse your hair and body thoroughly to remove shampoo/soap residue.  Turn the water OFF and apply about 3 tablespoons (45 ml) of CHG  soap to a CLEAN washcloth.  Apply CHG soap ONLY FROM YOUR NECK DOWN TO YOUR TOES (washing for 3-5 minutes)  DO NOT use CHG soap on face, private areas, open wounds, or sores.  Pay special attention to the area where your surgery is being performed.  If you are having back surgery, having someone wash your back for you may be helpful. Wait 2 minutes after CHG soap is applied, then you may rinse off the CHG soap.  Pat dry with a clean towel  Put on clean clothes/pajamas   If you choose to wear lotion, please use ONLY the  CHG-compatible lotions on the back of this paper.     Additional instructions for the day of surgery: DO NOT APPLY any lotions, deodorants, cologne, or perfumes.   Put on clean/comfortable clothes.  Brush your teeth.  Ask your nurse before applying any prescription medications to the skin.   CHG Compatible Lotions   Aveeno Moisturizing lotion  Cetaphil Moisturizing Cream  Cetaphil Moisturizing Lotion  Clairol Herbal Essence Moisturizing Lotion, Dry Skin  Clairol Herbal Essence Moisturizing Lotion, Extra Dry Skin  Clairol Herbal Essence Moisturizing Lotion, Normal Skin  Curel Age Defying Therapeutic Moisturizing Lotion with Alpha Hydroxy  Curel Extreme Care Body Lotion  Curel Soothing Hands Moisturizing Hand Lotion  Curel Therapeutic Moisturizing Cream, Fragrance-Free  Curel Therapeutic Moisturizing Lotion, Fragrance-Free  Curel Therapeutic Moisturizing Lotion, Original Formula  Eucerin Daily Replenishing Lotion  Eucerin Dry Skin Therapy Plus Alpha Hydroxy Crme  Eucerin Dry Skin Therapy Plus Alpha Hydroxy Lotion  Eucerin Original Crme  Eucerin Original Lotion  Eucerin Plus Crme Eucerin Plus Lotion  Eucerin TriLipid Replenishing Lotion  Keri Anti-Bacterial Hand Lotion  Keri Deep Conditioning Original Lotion Dry Skin Formula Softly Scented  Keri Deep Conditioning Original Lotion, Fragrance Free Sensitive Skin Formula  Keri Lotion Fast Absorbing Fragrance Free Sensitive Skin Formula  Keri Lotion Fast Absorbing Softly Scented Dry Skin Formula  Keri Original Lotion  Keri Skin Renewal Lotion Keri Silky Smooth Lotion  Keri Silky Smooth Sensitive Skin Lotion  Nivea Body Creamy Conditioning Oil  Nivea Body Extra Enriched Lotion  Nivea Body Original Lotion  Nivea Body Sheer Moisturizing Lotion Nivea Crme  Nivea Skin Firming Lotion  NutraDerm 30 Skin Lotion  NutraDerm Skin Lotion  NutraDerm Therapeutic Skin Cream  NutraDerm Therapeutic Skin Lotion  ProShield Protective Hand  Cream  Provon moisturizing lotion   Incentive Spirometer  An incentive spirometer is a tool that can help keep your lungs clear and active. This tool measures how well you are filling your lungs with each breath. Taking long deep breaths may help reverse or decrease the chance of developing breathing (pulmonary) problems (especially infection) following: A long period of time when you are unable to move or be active. BEFORE THE PROCEDURE  If the spirometer includes an indicator to show your best effort, your nurse or respiratory therapist will set it to a desired goal. If possible, sit up straight or lean slightly forward. Try not to slouch. Hold the incentive spirometer in an upright position. INSTRUCTIONS FOR USE  Sit on the edge of your bed if possible, or sit up as far as you can in bed or on a chair. Hold the incentive spirometer in an upright position. Breathe out normally. Place the mouthpiece in your mouth and seal your lips tightly around it. Breathe in slowly and as deeply as possible, raising the piston or the ball toward the top of the column. Hold your breath for 3-5  seconds or for as long as possible. Allow the piston or ball to fall to the bottom of the column. Remove the mouthpiece from your mouth and breathe out normally. Rest for a few seconds and repeat Steps 1 through 7 at least 10 times every 1-2 hours when you are awake. Take your time and take a few normal breaths between deep breaths. The spirometer may include an indicator to show your best effort. Use the indicator as a goal to work toward during each repetition. After each set of 10 deep breaths, practice coughing to be sure your lungs are clear. If you have an incision (the cut made at the time of surgery), support your incision when coughing by placing a pillow or rolled up towels firmly against it. Once you are able to get out of bed, walk around indoors and cough well. You may stop using the incentive spirometer  when instructed by your caregiver.  RISKS AND COMPLICATIONS Take your time so you do not get dizzy or light-headed. If you are in pain, you may need to take or ask for pain medication before doing incentive spirometry. It is harder to take a deep breath if you are having pain. AFTER USE Rest and breathe slowly and easily. It can be helpful to keep track of a log of your progress. Your caregiver can provide you with a simple table to help with this. If you are using the spirometer at home, follow these instructions: SEEK MEDICAL CARE IF:  You are having difficultly using the spirometer. You have trouble using the spirometer as often as instructed. Your pain medication is not giving enough relief while using the spirometer. You develop fever of 100.5 F (38.1 C) or higher. SEEK IMMEDIATE MEDICAL CARE IF:  You cough up bloody sputum that had not been present before. You develop fever of 102 F (38.9 C) or greater. You develop worsening pain at or near the incision site. MAKE SURE YOU:  Understand these instructions. Will watch your condition. Will get help right away if you are not doing well or get worse. Document Released: 12/06/2006 Document Revised: 10/18/2011 Document Reviewed: 02/06/2007 [facility] Patient Information 2014 [place], [place].   ________________________________________________________________________

## 2023-11-10 ENCOUNTER — Encounter (HOSPITAL_COMMUNITY)
Admission: RE | Admit: 2023-11-10 | Discharge: 2023-11-10 | Disposition: A | Discharge status: Home / Self Care | Source: Ambulatory Visit | Attending: Orthopedic Surgery | Admitting: Orthopedic Surgery

## 2023-11-10 ENCOUNTER — Other Ambulatory Visit: Payer: Self-pay

## 2023-11-10 ENCOUNTER — Encounter (HOSPITAL_COMMUNITY): Payer: Self-pay

## 2023-11-10 VITALS — BP 108/66 | HR 102 | Temp 98.0°F | Ht 68.0 in | Wt 155.0 lb

## 2023-11-10 DIAGNOSIS — Z01818 Encounter for other preprocedural examination: Secondary | ICD-10-CM

## 2023-11-10 DIAGNOSIS — Z01812 Encounter for preprocedural laboratory examination: Secondary | ICD-10-CM | POA: Insufficient documentation

## 2023-11-10 DIAGNOSIS — Z79899 Other long term (current) drug therapy: Secondary | ICD-10-CM | POA: Diagnosis not present

## 2023-11-10 HISTORY — DX: Emphysema, unspecified: J43.9

## 2023-11-10 HISTORY — DX: Unspecified osteoarthritis, unspecified site: M19.90

## 2023-11-10 HISTORY — DX: Anxiety disorder, unspecified: F41.9

## 2023-11-10 HISTORY — DX: Depression, unspecified: F32.A

## 2023-11-10 HISTORY — DX: Unspecified asthma, uncomplicated: J45.909

## 2023-11-10 HISTORY — DX: Dyspnea, unspecified: R06.00

## 2023-11-10 HISTORY — DX: Hypothyroidism, unspecified: E03.9

## 2023-11-10 HISTORY — DX: Chronic obstructive pulmonary disease, unspecified: J44.9

## 2023-11-10 HISTORY — DX: Malignant (primary) neoplasm, unspecified: C80.1

## 2023-11-10 LAB — CBC
HCT: 38.2 % (ref 36.0–46.0)
Hemoglobin: 12.1 g/dL (ref 12.0–15.0)
MCH: 29.2 pg (ref 26.0–34.0)
MCHC: 31.7 g/dL (ref 30.0–36.0)
MCV: 92.3 fL (ref 80.0–100.0)
Platelets: 494 10*3/uL — ABNORMAL HIGH (ref 150–400)
RBC: 4.14 MIL/uL (ref 3.87–5.11)
RDW: 12.5 % (ref 11.5–15.5)
WBC: 12.7 10*3/uL — ABNORMAL HIGH (ref 4.0–10.5)
nRBC: 0 % (ref 0.0–0.2)

## 2023-11-10 LAB — SURGICAL PCR SCREEN
MRSA, PCR: NEGATIVE
Staphylococcus aureus: NEGATIVE

## 2023-11-10 NOTE — Progress Notes (Signed)
 For Anesthesia: PCP - Benita Stabile, MD  Cardiologist -N/A   Bowel Prep reminder:  Chest x-ray -  EKG -  Stress Test -  ECHO -  Cardiac Cath -  Pacemaker/ICD device last checked: Pacemaker orders received: Device Rep notified:  Spinal Cord Stimulator:  Sleep Study -N/A  CPAP -   Fasting Blood Sugar -N/A   Checks Blood Sugar _____ times a day Date and result of last Hgb A1c-  Last dose of GLP1 agonist- N/A  GLP1 instructions:   Last dose of SGLT-2 inhibitors- N/A  SGLT-2 instructions:   Blood Thinner Instructions:N/A  Aspirin Instructions: Last Dose:  Activity level: Can go up a flight of stairs and activities of daily living without stopping and without chest pain and/or shortness of breath   Able to exercise without chest pain and/or shortness of breath  Anesthesia review:   Patient denies shortness of breath, fever, cough and chest pain at PAT appointment   Patient verbalized understanding of instructions that were given to them at the PAT appointment. Patient was also instructed that they will need to review over the PAT instructions again at home before surgery.

## 2023-11-22 ENCOUNTER — Observation Stay (HOSPITAL_COMMUNITY)
Admission: RE | Admit: 2023-11-22 | Discharge: 2023-11-23 | Disposition: A | Discharge status: Home / Self Care | Payer: Self-pay | Source: Ambulatory Visit | Attending: Orthopedic Surgery | Admitting: Orthopedic Surgery

## 2023-11-22 ENCOUNTER — Other Ambulatory Visit: Payer: Self-pay

## 2023-11-22 ENCOUNTER — Ambulatory Visit (HOSPITAL_COMMUNITY): Payer: Self-pay | Admitting: Anesthesiology

## 2023-11-22 ENCOUNTER — Encounter (HOSPITAL_COMMUNITY): Payer: Self-pay | Admitting: Orthopedic Surgery

## 2023-11-22 ENCOUNTER — Ambulatory Visit (HOSPITAL_BASED_OUTPATIENT_CLINIC_OR_DEPARTMENT_OTHER): Payer: Self-pay | Admitting: Anesthesiology

## 2023-11-22 ENCOUNTER — Encounter (HOSPITAL_COMMUNITY)
Admission: RE | Disposition: A | Discharge status: Home / Self Care | Payer: Self-pay | Source: Ambulatory Visit | Attending: Orthopedic Surgery

## 2023-11-22 DIAGNOSIS — E039 Hypothyroidism, unspecified: Secondary | ICD-10-CM | POA: Diagnosis not present

## 2023-11-22 DIAGNOSIS — Z87891 Personal history of nicotine dependence: Secondary | ICD-10-CM

## 2023-11-22 DIAGNOSIS — Z85828 Personal history of other malignant neoplasm of skin: Secondary | ICD-10-CM | POA: Insufficient documentation

## 2023-11-22 DIAGNOSIS — J449 Chronic obstructive pulmonary disease, unspecified: Secondary | ICD-10-CM

## 2023-11-22 DIAGNOSIS — M1711 Unilateral primary osteoarthritis, right knee: Secondary | ICD-10-CM

## 2023-11-22 DIAGNOSIS — G8918 Other acute postprocedural pain: Secondary | ICD-10-CM | POA: Diagnosis not present

## 2023-11-22 DIAGNOSIS — Z79899 Other long term (current) drug therapy: Secondary | ICD-10-CM | POA: Diagnosis not present

## 2023-11-22 DIAGNOSIS — Z96651 Presence of right artificial knee joint: Principal | ICD-10-CM

## 2023-11-22 HISTORY — PX: TOTAL KNEE ARTHROPLASTY: SHX125

## 2023-11-22 SURGERY — ARTHROPLASTY, KNEE, TOTAL
Anesthesia: Monitor Anesthesia Care | Site: Knee | Laterality: Right

## 2023-11-22 MED ORDER — CEFAZOLIN SODIUM-DEXTROSE 2-3 GM-%(50ML) IV SOLR
INTRAVENOUS | Status: DC | PRN
  Administered 2023-11-22: 2 g via INTRAVENOUS

## 2023-11-22 MED ORDER — BUPIVACAINE-EPINEPHRINE (PF) 0.25% -1:200000 IJ SOLN
INTRAMUSCULAR | Status: AC
  Filled 2023-11-22: qty 30

## 2023-11-22 MED ORDER — FENTANYL CITRATE PF 50 MCG/ML IJ SOSY: 25.0000 ug | PREFILLED_SYRINGE | INTRAMUSCULAR | Status: DC | PRN

## 2023-11-22 MED ORDER — STERILE WATER FOR IRRIGATION IR SOLN
Status: DC | PRN
  Administered 2023-11-22: 1000 mL

## 2023-11-22 MED ORDER — LACTATED RINGERS IV SOLN: INTRAVENOUS | Status: DC

## 2023-11-22 MED ORDER — CEFAZOLIN SODIUM-DEXTROSE 2-4 GM/100ML-% IV SOLN
2.0000 g | Freq: Four times a day (QID) | INTRAVENOUS | Status: AC
  Administered 2023-11-22 – 2023-11-23 (×2): 2 g via INTRAVENOUS
  Filled 2023-11-22 (×2): qty 100

## 2023-11-22 MED ORDER — BUPIVACAINE-EPINEPHRINE (PF) 0.5% -1:200000 IJ SOLN
INTRAMUSCULAR | Status: DC | PRN
  Administered 2023-11-22: 20 mL via PERINEURAL

## 2023-11-22 MED ORDER — DIPHENHYDRAMINE HCL 12.5 MG/5ML PO ELIX: 12.5000 mg | ORAL_SOLUTION | ORAL | Status: DC | PRN

## 2023-11-22 MED ORDER — DEXAMETHASONE SODIUM PHOSPHATE 10 MG/ML IJ SOLN
10.0000 mg | Freq: Once | INTRAMUSCULAR | Status: AC
  Administered 2023-11-23: 10 mg via INTRAVENOUS
  Filled 2023-11-22: qty 1

## 2023-11-22 MED ORDER — ALUM & MAG HYDROXIDE-SIMETH 200-200-20 MG/5ML PO SUSP: 30.0000 mL | ORAL | Status: DC | PRN

## 2023-11-22 MED ORDER — ONDANSETRON HCL 4 MG/2ML IJ SOLN: 4.0000 mg | Freq: Four times a day (QID) | INTRAMUSCULAR | Status: DC | PRN

## 2023-11-22 MED ORDER — PHENOL 1.4 % MT LIQD: 1.0000 | OROMUCOSAL | Status: DC | PRN

## 2023-11-22 MED ORDER — PROPOFOL 1000 MG/100ML IV EMUL
INTRAVENOUS | Status: AC
  Filled 2023-11-22: qty 100

## 2023-11-22 MED ORDER — SODIUM CHLORIDE (PF) 0.9 % IJ SOLN
INTRAMUSCULAR | Status: DC | PRN
  Administered 2023-11-22: 61 mL

## 2023-11-22 MED ORDER — HYDROMORPHONE HCL 1 MG/ML IJ SOLN: 0.5000 mg | INTRAMUSCULAR | Status: DC | PRN

## 2023-11-22 MED ORDER — METOCLOPRAMIDE HCL 5 MG PO TABS: 5.0000 mg | ORAL_TABLET | Freq: Three times a day (TID) | ORAL | Status: DC | PRN

## 2023-11-22 MED ORDER — OXYCODONE HCL 5 MG/5ML PO SOLN: 5.0000 mg | Freq: Once | ORAL | Status: DC | PRN

## 2023-11-22 MED ORDER — MEPERIDINE HCL 50 MG/ML IJ SOLN: 6.2500 mg | INTRAMUSCULAR | Status: DC | PRN

## 2023-11-22 MED ORDER — OXYCODONE HCL 5 MG PO TABS
5.0000 mg | ORAL_TABLET | ORAL | Status: DC | PRN
  Administered 2023-11-22: 5 mg via ORAL
  Filled 2023-11-22 (×2): qty 1

## 2023-11-22 MED ORDER — OXYCODONE HCL 5 MG PO TABS: 10.0000 mg | ORAL_TABLET | ORAL | Status: DC | PRN

## 2023-11-22 MED ORDER — ONDANSETRON HCL 4 MG/2ML IJ SOLN: 4.0000 mg | Freq: Once | INTRAMUSCULAR | Status: DC | PRN

## 2023-11-22 MED ORDER — BISACODYL 10 MG RE SUPP: 10.0000 mg | Freq: Every day | RECTAL | Status: DC | PRN

## 2023-11-22 MED ORDER — SODIUM CHLORIDE 0.9 % IR SOLN
Status: DC | PRN
  Administered 2023-11-22: 1000 mL

## 2023-11-22 MED ORDER — MIDAZOLAM HCL 2 MG/2ML IJ SOLN
2.0000 mg | Freq: Once | INTRAMUSCULAR | Status: AC
  Administered 2023-11-22: 2 mg via INTRAVENOUS
  Filled 2023-11-22: qty 2

## 2023-11-22 MED ORDER — METHOCARBAMOL 1000 MG/10ML IJ SOLN: 500.0000 mg | Freq: Four times a day (QID) | INTRAMUSCULAR | Status: DC | PRN

## 2023-11-22 MED ORDER — FLUTICASONE FUROATE-VILANTEROL 200-25 MCG/ACT IN AEPB
1.0000 | INHALATION_SPRAY | Freq: Every day | RESPIRATORY_TRACT | Status: DC
  Administered 2023-11-23: 1 via RESPIRATORY_TRACT
  Filled 2023-11-22: qty 28

## 2023-11-22 MED ORDER — CELECOXIB 200 MG PO CAPS
200.0000 mg | ORAL_CAPSULE | Freq: Two times a day (BID) | ORAL | Status: DC
  Administered 2023-11-22: 200 mg via ORAL
  Filled 2023-11-22: qty 1

## 2023-11-22 MED ORDER — POVIDONE-IODINE 10 % EX SWAB: 2.0000 | Freq: Once | CUTANEOUS | Status: DC

## 2023-11-22 MED ORDER — ACETAMINOPHEN 500 MG PO TABS
1000.0000 mg | ORAL_TABLET | Freq: Four times a day (QID) | ORAL | Status: DC
  Administered 2023-11-22 – 2023-11-23 (×3): 1000 mg via ORAL
  Filled 2023-11-22 (×4): qty 2

## 2023-11-22 MED ORDER — SODIUM CHLORIDE 0.9% FLUSH: 3.0000 mL | INTRAVENOUS | Status: DC | PRN

## 2023-11-22 MED ORDER — MENTHOL 3 MG MT LOZG: 1.0000 | LOZENGE | OROMUCOSAL | Status: DC | PRN

## 2023-11-22 MED ORDER — SENNA 8.6 MG PO TABS
2.0000 | ORAL_TABLET | Freq: Every day | ORAL | Status: DC
  Administered 2023-11-22: 17.2 mg via ORAL
  Filled 2023-11-22: qty 2

## 2023-11-22 MED ORDER — 0.9 % SODIUM CHLORIDE (POUR BTL) OPTIME
TOPICAL | Status: DC | PRN
  Administered 2023-11-22: 1000 mL

## 2023-11-22 MED ORDER — ORAL CARE MOUTH RINSE: 15.0000 mL | Freq: Once | OROMUCOSAL | Status: AC

## 2023-11-22 MED ORDER — PROPOFOL 500 MG/50ML IV EMUL
INTRAVENOUS | Status: DC | PRN
  Administered 2023-11-22: 30 mg via INTRAVENOUS
  Administered 2023-11-22: 80 ug/kg/min via INTRAVENOUS

## 2023-11-22 MED ORDER — TRANEXAMIC ACID-NACL 1000-0.7 MG/100ML-% IV SOLN
1000.0000 mg | Freq: Once | INTRAVENOUS | Status: AC
  Administered 2023-11-22: 1000 mg via INTRAVENOUS
  Filled 2023-11-22: qty 100

## 2023-11-22 MED ORDER — ASPIRIN 81 MG PO CHEW
81.0000 mg | CHEWABLE_TABLET | Freq: Two times a day (BID) | ORAL | Status: DC
  Administered 2023-11-22 – 2023-11-23 (×2): 81 mg via ORAL
  Filled 2023-11-22 (×2): qty 1

## 2023-11-22 MED ORDER — MIDAZOLAM HCL 2 MG/2ML IJ SOLN
INTRAMUSCULAR | Status: DC | PRN
  Administered 2023-11-22 (×2): 1 mg via INTRAVENOUS

## 2023-11-22 MED ORDER — MIDAZOLAM HCL 2 MG/2ML IJ SOLN
INTRAMUSCULAR | Status: AC
  Filled 2023-11-22: qty 2

## 2023-11-22 MED ORDER — KETOROLAC TROMETHAMINE 30 MG/ML IJ SOLN
INTRAMUSCULAR | Status: AC
  Filled 2023-11-22: qty 1

## 2023-11-22 MED ORDER — OXYCODONE HCL 5 MG PO TABS: 5.0000 mg | ORAL_TABLET | Freq: Once | ORAL | Status: DC | PRN

## 2023-11-22 MED ORDER — SODIUM CHLORIDE (PF) 0.9 % IJ SOLN
INTRAMUSCULAR | Status: AC
  Filled 2023-11-22: qty 30

## 2023-11-22 MED ORDER — TRANEXAMIC ACID-NACL 1000-0.7 MG/100ML-% IV SOLN
1000.0000 mg | INTRAVENOUS | Status: AC
  Administered 2023-11-22: 1000 mg via INTRAVENOUS
  Filled 2023-11-22: qty 100

## 2023-11-22 MED ORDER — FENTANYL CITRATE PF 50 MCG/ML IJ SOSY
100.0000 ug | PREFILLED_SYRINGE | Freq: Once | INTRAMUSCULAR | Status: AC
  Administered 2023-11-22: 100 ug via INTRAVENOUS
  Filled 2023-11-22: qty 2

## 2023-11-22 MED ORDER — CHLORHEXIDINE GLUCONATE 0.12 % MT SOLN
15.0000 mL | Freq: Once | OROMUCOSAL | Status: AC
  Administered 2023-11-22: 15 mL via OROMUCOSAL

## 2023-11-22 MED ORDER — ALPRAZOLAM 0.5 MG PO TABS: 0.5000 mg | ORAL_TABLET | Freq: Every evening | ORAL | Status: DC | PRN

## 2023-11-22 MED ORDER — POLYETHYLENE GLYCOL 3350 17 G PO PACK
17.0000 g | PACK | Freq: Two times a day (BID) | ORAL | Status: DC
  Administered 2023-11-23: 17 g via ORAL
  Filled 2023-11-22 (×2): qty 1

## 2023-11-22 MED ORDER — ONDANSETRON HCL 4 MG PO TABS: 4.0000 mg | ORAL_TABLET | Freq: Four times a day (QID) | ORAL | Status: DC | PRN

## 2023-11-22 MED ORDER — EZETIMIBE 10 MG PO TABS
10.0000 mg | ORAL_TABLET | Freq: Every day | ORAL | Status: DC
  Administered 2023-11-23: 10 mg via ORAL
  Filled 2023-11-22: qty 1

## 2023-11-22 MED ORDER — BUPIVACAINE IN DEXTROSE 0.75-8.25 % IT SOLN
INTRATHECAL | Status: DC | PRN
  Administered 2023-11-22: 1.8 mL via INTRATHECAL

## 2023-11-22 MED ORDER — PHENYLEPHRINE HCL (PRESSORS) 10 MG/ML IV SOLN
INTRAVENOUS | Status: AC
  Filled 2023-11-22: qty 1

## 2023-11-22 MED ORDER — METHOCARBAMOL 500 MG PO TABS: 500.0000 mg | ORAL_TABLET | Freq: Four times a day (QID) | ORAL | Status: DC | PRN

## 2023-11-22 MED ORDER — CEFAZOLIN SODIUM-DEXTROSE 2-4 GM/100ML-% IV SOLN
2.0000 g | INTRAVENOUS | Status: DC
  Filled 2023-11-22: qty 100

## 2023-11-22 MED ORDER — DEXAMETHASONE SODIUM PHOSPHATE 10 MG/ML IJ SOLN: 8.0000 mg | Freq: Once | INTRAMUSCULAR | Status: DC

## 2023-11-22 MED ORDER — METOCLOPRAMIDE HCL 5 MG/ML IJ SOLN: 5.0000 mg | Freq: Three times a day (TID) | INTRAMUSCULAR | Status: DC | PRN

## 2023-11-22 MED ORDER — UMECLIDINIUM BROMIDE 62.5 MCG/ACT IN AEPB
1.0000 | INHALATION_SPRAY | Freq: Every day | RESPIRATORY_TRACT | Status: DC
  Administered 2023-11-23: 1 via RESPIRATORY_TRACT
  Filled 2023-11-22: qty 7

## 2023-11-22 MED ORDER — ALBUTEROL SULFATE (2.5 MG/3ML) 0.083% IN NEBU: 2.5000 mg | INHALATION_SOLUTION | Freq: Four times a day (QID) | RESPIRATORY_TRACT | Status: DC | PRN

## 2023-11-22 MED ORDER — FENTANYL CITRATE (PF) 100 MCG/2ML IJ SOLN
INTRAMUSCULAR | Status: AC
  Filled 2023-11-22: qty 2

## 2023-11-22 MED ORDER — LEVOTHYROXINE SODIUM 112 MCG PO TABS
112.0000 ug | ORAL_TABLET | Freq: Every day | ORAL | Status: DC
  Administered 2023-11-23: 112 ug via ORAL
  Filled 2023-11-22: qty 1

## 2023-11-22 MED ORDER — SODIUM CHLORIDE 0.9% FLUSH
3.0000 mL | Freq: Two times a day (BID) | INTRAVENOUS | Status: DC
  Administered 2023-11-22: 10 mL via INTRAVENOUS
  Administered 2023-11-23: 5 mL via INTRAVENOUS

## 2023-11-22 MED ORDER — PHENYLEPHRINE HCL-NACL 20-0.9 MG/250ML-% IV SOLN
INTRAVENOUS | Status: DC | PRN
  Administered 2023-11-22: 30 ug/min via INTRAVENOUS

## 2023-11-22 MED ORDER — TIOTROPIUM BROMIDE MONOHYDRATE 18 MCG IN CAPS: 18.0000 ug | ORAL_CAPSULE | Freq: Every day | RESPIRATORY_TRACT | Status: DC

## 2023-11-22 MED ORDER — FLUOXETINE HCL 20 MG PO CAPS
40.0000 mg | ORAL_CAPSULE | Freq: Every day | ORAL | Status: DC
Start: 2023-11-23 — End: 2023-11-23
  Administered 2023-11-23: 40 mg via ORAL
  Filled 2023-11-22: qty 2

## 2023-11-22 SURGICAL SUPPLY — 47 items
ATTUNE MED ANAT PAT 32 KNEE (Knees) IMPLANT
BAG COUNTER SPONGE SURGICOUNT (BAG) IMPLANT
BAG ZIPLOCK 12X15 (MISCELLANEOUS) IMPLANT
BASEPLATE TIB CMT FB PCKT SZ5 (Knees) IMPLANT
BLADE SAW SGTL 13.0X1.19X90.0M (BLADE) ×1 IMPLANT
BNDG ELASTIC 6INX 5YD STR LF (GAUZE/BANDAGES/DRESSINGS) ×1 IMPLANT
BOWL SMART MIX CTS (DISPOSABLE) ×1 IMPLANT
CEMENT HV SMART SET (Cement) ×2 IMPLANT
COMP FEM CMT ATTUNE NRW 4 RT (Joint) ×1 IMPLANT
COMPONENT FEM CMT ATTN NRW 4RT (Joint) IMPLANT
COVER SURGICAL LIGHT HANDLE (MISCELLANEOUS) ×1 IMPLANT
CUFF TRNQT CYL 34X4.125X (TOURNIQUET CUFF) ×1 IMPLANT
DERMABOND ADVANCED .7 DNX12 (GAUZE/BANDAGES/DRESSINGS) ×1 IMPLANT
DRAPE U-SHAPE 47X51 STRL (DRAPES) ×1 IMPLANT
DRESSING AQUACEL AG SP 3.5X10 (GAUZE/BANDAGES/DRESSINGS) ×1 IMPLANT
DRSG AQUACEL AG SP 3.5X10 (GAUZE/BANDAGES/DRESSINGS) ×1 IMPLANT
DURAPREP 26ML APPLICATOR (WOUND CARE) ×2 IMPLANT
ELECT REM PT RETURN 15FT ADLT (MISCELLANEOUS) ×1 IMPLANT
GLOVE BIO SURGEON STRL SZ 6 (GLOVE) ×1 IMPLANT
GLOVE BIOGEL PI IND STRL 6.5 (GLOVE) ×1 IMPLANT
GLOVE BIOGEL PI IND STRL 7.5 (GLOVE) ×1 IMPLANT
GLOVE ORTHO TXT STRL SZ7.5 (GLOVE) ×2 IMPLANT
GOWN STRL REUS W/ TWL LRG LVL3 (GOWN DISPOSABLE) ×2 IMPLANT
HOLDER FOLEY CATH W/STRAP (MISCELLANEOUS) IMPLANT
INSERT TIB MED ATTUNE 4 7 RT (Insert) IMPLANT
KIT TURNOVER KIT A (KITS) ×1 IMPLANT
MANIFOLD NEPTUNE II (INSTRUMENTS) ×1 IMPLANT
NDL SAFETY ECLIPSE 18X1.5 (NEEDLE) IMPLANT
NS IRRIG 1000ML POUR BTL (IV SOLUTION) ×1 IMPLANT
PACK TOTAL KNEE CUSTOM (KITS) ×1 IMPLANT
PENCIL SMOKE EVACUATOR (MISCELLANEOUS) ×1 IMPLANT
PIN FIX SIGMA LCS THRD HI (PIN) IMPLANT
PROTECTOR NERVE ULNAR (MISCELLANEOUS) ×1 IMPLANT
SET HNDPC FAN SPRY TIP SCT (DISPOSABLE) ×1 IMPLANT
SET PAD KNEE POSITIONER (MISCELLANEOUS) ×1 IMPLANT
SPIKE FLUID TRANSFER (MISCELLANEOUS) ×2 IMPLANT
SUT MNCRL AB 4-0 PS2 18 (SUTURE) ×1 IMPLANT
SUT STRATAFIX PDS+ 0 24IN (SUTURE) ×1 IMPLANT
SUT VIC AB 0 CT1 36 (SUTURE) ×1 IMPLANT
SUT VIC AB 1 CT1 36 (SUTURE) ×1 IMPLANT
SUT VIC AB 2-0 CT1 TAPERPNT 27 (SUTURE) ×2 IMPLANT
SYR 3ML LL SCALE MARK (SYRINGE) ×1 IMPLANT
TOWEL GREEN STERILE FF (TOWEL DISPOSABLE) ×1 IMPLANT
TRAY FOLEY MTR SLVR 14FR STAT (SET/KITS/TRAYS/PACK) IMPLANT
TUBE SUCTION HIGH CAP CLEAR NV (SUCTIONS) ×1 IMPLANT
WATER STERILE IRR 1000ML POUR (IV SOLUTION) ×2 IMPLANT
WRAP KNEE MAXI GEL POST OP (GAUZE/BANDAGES/DRESSINGS) ×1 IMPLANT

## 2023-11-22 NOTE — Anesthesia Procedure Notes (Signed)
 Spinal  Patient location during procedure: OR Start time: 11/22/2023 2:45 PM End time: 11/22/2023 2:48 PM Reason for block: surgical anesthesia Staffing Anesthesiologist: Rhenda Cedars, MD Performed by: Rhenda Cedars, MD Authorized by: Rhenda Cedars, MD   Preanesthetic Checklist Completed: patient identified, IV checked, site marked, risks and benefits discussed, surgical consent, monitors and equipment checked, pre-op evaluation and timeout performed Spinal Block Patient position: sitting Prep: DuraPrep Patient monitoring: heart rate, cardiac monitor, continuous pulse ox and blood pressure Approach: midline Location: L3-4 Injection technique: single-shot Needle Needle type: Sprotte  Needle gauge: 24 G Needle length: 9 cm Assessment Sensory level: T4 Events: CSF return

## 2023-11-22 NOTE — Anesthesia Postprocedure Evaluation (Signed)
 Anesthesia Post Note  Patient: [name]  [name]  Procedure(s) Performed: ARTHROPLASTY, KNEE, TOTAL (Right: Knee)     Patient location during evaluation: PACU Anesthesia Type: Regional, MAC and Spinal Level of consciousness: oriented and awake and alert Pain management: pain level controlled Vital Signs Assessment: post-procedure vital signs reviewed and stable Respiratory status: spontaneous breathing, respiratory function stable and patient connected to nasal cannula oxygen Cardiovascular status: blood pressure returned to baseline and stable Postop Assessment: no headache, no backache and no apparent nausea or vomiting Anesthetic complications: no   No notable events documented.  Last Vitals:  Vitals:   11/22/23 1615 11/22/23 1616  BP: (!) 89/56 (!) 92/59  Pulse: 96 97  Resp: 15 12  Temp: 36.6 C   SpO2: 97% 97%    Last Pain:  Vitals:   11/22/23 1616  TempSrc:   PainSc: 0-No pain                 Josecarlos Harriott

## 2023-11-22 NOTE — Discharge Instructions (Signed)

## 2023-11-22 NOTE — Transfer of Care (Signed)
 Immediate Anesthesia Transfer of Care Note  Patient: [name]  [name]  Procedure(s) Performed: ARTHROPLASTY, KNEE, TOTAL (Right: Knee)  Patient Location: PACU  Anesthesia Type:MAC, Regional, and Spinal  Level of Consciousness: awake, alert , and oriented  Airway & Oxygen Therapy: Patient Spontanous Breathing  Post-op Assessment: Report given to RN and Post -op Vital signs reviewed and stable  Post vital signs: Reviewed and stable  Last Vitals:  Vitals Value Taken Time  BP 92/59 11/22/23 1613  Temp    Pulse 96 11/22/23 1614  Resp 15 11/22/23 1614  SpO2 97 % 11/22/23 1614  Vitals shown include unfiled device data.  Last Pain:  Vitals:   11/22/23 1317  TempSrc: Oral  PainSc:          Complications: No notable events documented.

## 2023-11-22 NOTE — Interval H&P Note (Signed)
 History and Physical Interval Note:  11/22/2023 1:23 PM  [name]  [name]  has presented today for surgery, with the diagnosis of Right knee osteoarthritis.  The various methods of treatment have been discussed with the patient and family. After consideration of risks, benefits and other options for treatment, the patient has consented to  Procedure(s): ARTHROPLASTY, KNEE, TOTAL (Right) as a surgical intervention.  The patient's history has been reviewed, patient examined, no change in status, stable for surgery.  I have reviewed the patient's chart and labs.  Questions were answered to the patient's satisfaction.     Bevin Bucks

## 2023-11-22 NOTE — Anesthesia Procedure Notes (Signed)
 Procedure Name: MAC Date/Time: 11/22/2023 2:42 PM  Performed by: Norvell Beers, CRNAPre-anesthesia Checklist: Patient identified, Emergency Drugs available, Suction available and Patient being monitored Patient Re-evaluated:Patient Re-evaluated prior to induction Oxygen Delivery Method: Simple face mask Preoxygenation: Pre-oxygenation with 100% oxygen Induction Type: IV induction Placement Confirmation: positive ETCO2

## 2023-11-22 NOTE — Op Note (Signed)
 NAME:  [name] RECORD NO.:  [id number]                             FACILITY:  [facility]      PHYSICIAN:  Madlyn Frankel. Charlann Boxer, M.D.  DATE OF BIRTH:  Jan 18, 1956      DATE OF PROCEDURE:  11/22/2023                                     OPERATIVE REPORT         PREOPERATIVE DIAGNOSIS:  Right knee osteoarthritis.      POSTOPERATIVE DIAGNOSIS:  Right knee osteoarthritis.      FINDINGS:  The patient was noted to have complete loss of cartilage and   bone-on-bone arthritis with associated osteophytes in the medial and patellofemoral compartments of   the knee with a significant synovitis and associated effusion.  The patient had failed months of conservative treatment including medications, injection therapy, activity modification.     PROCEDURE:  Right total knee replacement.      COMPONENTS USED:  DePuy Attune FB CR MS knee   system, a size 4N femur, 5 tibia, size 7 mm CR MS AOX insert, and 32 anatomic patellar   button.      SURGEON:  Madlyn Frankel. Charlann Boxer, M.D.      ASSISTANT:  Rosalene Billings, PA-C.      ANESTHESIA:  Regional and Spinal.      SPECIMENS:  None.      COMPLICATION:  None.      DRAINS:  None.  EBL: <200 cc      TOURNIQUET TIME:  27 min at 225 mmHg     The patient was stable to the recovery room.      INDICATION FOR PROCEDURE:  [name] [name] is a 68 y.o. female patient of   mine.  The patient had been seen, evaluated, and treated for months conservatively in the   office with medication, activity modification, and injections.  The patient had   radiographic changes of bone-on-bone arthritis with endplate sclerosis and osteophytes noted.  Based on the radiographic changes and failed conservative measures, the patient   decided to proceed with definitive treatment, total knee replacement.  Risks of infection, DVT, component failure, need for revision surgery, neurovascular injury were reviewed in the office setting.  The postop course  was reviewed stressing the efforts to maximize post-operative satisfaction and function.  Consent was obtained for benefit of pain   relief.      PROCEDURE IN DETAIL:  The patient was brought to the operative theater.   Once adequate anesthesia, preoperative antibiotics, 2 gm of Ancef,1 gm of Tranexamic Acid, and 10 mg of Decadron administered, the patient was positioned supine with a right thigh tourniquet placed.  The  right lower extremity was prepped and draped in sterile fashion.  A time-   out was performed identifying the patient, planned procedure, and the appropriate extremity.      The right lower extremity was placed in the [facility] leg holder.  The leg was   exsanguinated, tourniquet elevated to 225 mmHg.  A midline incision was   made followed by median parapatellar arthrotomy.  Following initial   exposure, attention was first directed  to the patella.  Precut   measurement was noted to be 21 mm.  I resected down to 13 mm and used a   32 anatomic patellar button to restore patellar height as well as cover the cut surface.      The lug holes were drilled and a metal shim was placed to protect the   patella from retractors and saw blade during the procedure.      At this point, attention was now directed to the femur.  The femoral   canal was opened with a drill, irrigated to try to prevent fat emboli.  An   intramedullary rod was passed at 3 degrees valgus, 9 mm of bone was   resected off the distal femur.  Following this resection, the tibia was   subluxated anteriorly.  Using the extramedullary guide, 2 mm of bone was resected off   the proximal medial tibia.  We confirmed the gap would be   stable medially and laterally with a size 5 spacer block as well as confirmed that the tibial cut was perpendicular in the coronal plane, checking with an alignment rod.      Once this was done, I sized the femur to be a size 4 in the anterior-   posterior dimension, chose a narrow  component based on medial and   lateral dimension.  The size 4 rotation block was then pinned in   position anterior referenced using the C-clamp to set rotation.  The   anterior, posterior, and  chamfer cuts were made without difficulty nor   notching making certain that I was along the anterior cortex to help   with flexion gap stability.      The final box cut was made off the lateral aspect of distal femur.      At this point, the tibia was sized to be a size 5.  The size 5 tray was   then pinned in position through the medial third of the tubercle,   drilled, and keel punched.  Trial reduction was now carried with a 4 femur,  5 tibia, a size 7 mm CR MS insert, and the 32 anatomic patella botton.  The knee was brought to full extension with good flexion stability with the patella   tracking through the trochlea without application of pressure.  Given   all these findings the trial components removed.  Final components were   opened and cement was mixed.  The knee was irrigated with normal saline solution and pulse lavage.  The synovial lining was   then injected with 30 cc of 0.25% Marcaine with epinephrine, 1 cc of Toradol and 30 cc of NS for a total of 61 cc.     Final implants were then cemented onto cleaned and dried cut surfaces of bone with the knee brought to extension with a size 7 mm CR MS trial insert.      Once the cement had fully cured, excess cement was removed   throughout the knee.  I confirmed that I was satisfied with the range of   motion and stability, and the final size 7 mm CR MS AOX insert was chosen.  It was   placed into the knee.      The tourniquet had been let down at 27 minutes.  No significant   hemostasis was required.  The extensor mechanism was then reapproximated using #1 Vicryl and #1 Stratafix sutures with the knee   in flexion.  The  remaining wound was closed with 2-0 Vicryl and running 4-0 Monocryl.   The knee was cleaned, dried, dressed  sterilely using Dermabond and   Aquacel dressing.  The patient was then   brought to recovery room in stable condition, tolerating the procedure   well.   Please note that Physician Assistant, Kim Pen, PA-C was present for the entirety of the case, and was utilized for pre-operative positioning, peri-operative retractor management, general facilitation of the procedure and for primary wound closure at the end of the case.              Azalea Lento Bernard Brick, M.D.    11/22/2023 1:23 PM

## 2023-11-22 NOTE — Plan of Care (Signed)
  Problem: Education: Goal: Knowledge of General Education information will improve Description: Including pain rating scale, medication(s)/side effects and non-pharmacologic comfort measures Outcome: Progressing   Problem: Health Behavior/Discharge Planning: Goal: Ability to manage health-related needs will improve Outcome: Progressing   Problem: Clinical Measurements: Goal: Ability to maintain clinical measurements within normal limits will improve Outcome: Progressing Goal: Will remain free from infection Outcome: Progressing Goal: Diagnostic test results will improve Outcome: Progressing Goal: Respiratory complications will improve Outcome: Progressing Goal: Cardiovascular complication will be avoided Outcome: Progressing   Problem: Activity: Goal: Risk for activity intolerance will decrease Outcome: Adequate for Discharge   Problem: Nutrition: Goal: Adequate nutrition will be maintained Outcome: Completed/Met   Problem: Coping: Goal: Level of anxiety will decrease Outcome: Progressing   Problem: Elimination: Goal: Will not experience complications related to bowel motility Outcome: Progressing Goal: Will not experience complications related to urinary retention Outcome: Progressing   Problem: Pain Managment: Goal: General experience of comfort will improve and/or be controlled Outcome: Progressing   Problem: Safety: Goal: Ability to remain free from injury will improve Outcome: Progressing   Problem: Skin Integrity: Goal: Risk for impaired skin integrity will decrease Outcome: Progressing   Problem: Education: Goal: Knowledge of the prescribed therapeutic regimen will improve Outcome: Progressing Goal: Individualized Educational Video(s) Outcome: Completed/Met   Problem: Activity: Goal: Ability to avoid complications of mobility impairment will improve Outcome: Progressing Goal: Range of joint motion will improve Outcome: Adequate for Discharge    Problem: Clinical Measurements: Goal: Postoperative complications will be avoided or minimized Outcome: Progressing   Problem: Pain Management: Goal: Pain level will decrease with appropriate interventions Outcome: Progressing   Problem: Skin Integrity: Goal: Will show signs of wound healing Outcome: Progressing

## 2023-11-22 NOTE — H&P (Signed)
 TOTAL KNEE ADMISSION H&P  Patient is being admitted for right total knee arthroplasty.  Therapy Plans: outpatient therapy at [facility] Disposition: Home with daughter (lives together) Planned DVT Prophylaxis: aspirin 81mg  BID DME needed: walker PCP: Dr. Catalina Pizza - saw & had labs - waiting on form TXA: IV Allergies: NKDA Anesthesia Concerns: none BMI: 23.9 Last HgbA1c: Not diabetic   Other: - staying overnight - daughter had a difficult time with her knee - No hx of VTE or cancer - oxycodone, robaxin, tylenol, celebrex  Subjective:  Chief Complaint:right knee pain.  HPI: [place], 68 y.o. female, has a history of pain and functional disability in the right knee due to arthritis and has failed non-surgical conservative treatments for greater than 12 weeks to includeNSAID's and/or analgesics, corticosteriod injections, and activity modification.  Onset of symptoms was gradual, starting 2 years ago with gradually worsening course since that time. The patient noted no past surgery on the right knee(s).  Patient currently rates pain in the right knee(s) at 7 out of 10 with activity. Patient has worsening of pain with activity and weight bearing and pain that interferes with activities of daily living.  Patient has evidence of joint space narrowing by imaging studies. There is no active infection.  There are no active problems to display for this patient.  Past Medical History:  Diagnosis Date   Anxiety    Arthritis    Asthma    Cancer (HCC)    skin   COPD (chronic obstructive pulmonary disease) (HCC)    Depression    Dyspnea    Emphysema lung (HCC)    Hypothyroidism     Past Surgical History:  Procedure Laterality Date   BACK SURGERY     BREAST BIOPSY Left    2007 benign   CHOLECYSTECTOMY     COLONOSCOPY      No current facility-administered medications for this encounter.   Current Outpatient Medications  Medication Sig Dispense Refill Last Dose/Taking    albuterol (PROAIR HFA) 108 (90 BASE) MCG/ACT inhaler Inhale 2 puffs into the lungs every 6 (six) hours as needed for wheezing or shortness of breath.   Taking As Needed   ALPRAZolam (XANAX) 1 MG tablet Take 1-2 mg by mouth at bedtime as needed for anxiety or sleep.   Taking As Needed   diclofenac (VOLTAREN) 75 MG EC tablet Take 75 mg by mouth 2 (two) times daily.   Taking   ezetimibe (ZETIA) 10 MG tablet Take 10 mg by mouth daily.   Taking   FLUoxetine (PROZAC) 20 MG capsule Take 40 mg by mouth daily.   Taking   fluticasone-salmeterol (ADVAIR) 250-50 MCG/ACT AEPB Inhale 1 puff into the lungs 2 (two) times daily.   Taking   levothyroxine (SYNTHROID) 112 MCG tablet Take 112 mcg by mouth daily before breakfast.   Taking   methylphenidate (RITALIN) 10 MG tablet Take 10 mg by mouth daily.   Taking   Prenatal Vit-Fe Fumarate-FA (PRENATAL MULTIVITAMIN) TABS tablet Take 1 tablet by mouth daily at 12 noon.   Taking   tiotropium (SPIRIVA) 18 MCG inhalation capsule Place 18 mcg into inhaler and inhale daily.   Taking   moxifloxacin (AVELOX) 400 MG tablet Take 1 tablet (400 mg total) by mouth daily at 8 pm. (Patient not taking: Reported on 11/07/2023) 7 tablet 0 Not Taking   predniSONE (DELTASONE) 20 MG tablet Take 1 tablet (20 mg total) by mouth 2 (two) times daily. (Patient not taking: Reported on 11/07/2023)  10 tablet 0 Not Taking   No Known Allergies  Social History   Tobacco Use   Smoking status: Former    Types: Cigarettes   Smokeless tobacco: Not on file  Substance Use Topics   Alcohol use: Yes    Comment: occas.    Family History  Problem Relation Age of Onset   Breast cancer Mother    Breast cancer Paternal Aunt    Breast cancer Paternal Aunt    Breast cancer Paternal Aunt      Review of Systems  Constitutional:  Negative for chills and fever.  Respiratory:  Negative for cough and shortness of breath.   Cardiovascular:  Negative for chest pain.  Gastrointestinal:  Negative for  nausea and vomiting.  Musculoskeletal:  Positive for arthralgias.     Objective:  Physical Exam Well nourished and well developed. General: Alert and oriented x3, cooperative and pleasant, no acute distress.  Musculoskeletal: Bilateral knee exams: No skin related concerns No palpable effusion, warmth erythema Bilateral genu varum with slight flexion contractures with flexion over 100 degrees with tightness and discomfort bilaterally over the anterior medial knees Tenderness over the medial and anterior knees No lower extremity edema, erythema or calf tenderness  Vital signs in last 24 hours:    Labs:   Estimated body mass index is 23.57 kg/m as calculated from the following:   Height as of 11/10/23: 5\' 8"  (1.727 m).   Weight as of 11/10/23: 70.3 kg.   Imaging Review Plain radiographs demonstrate severe degenerative joint disease of the right knee(s). The overall alignment isneutral. The bone quality appears to be adequate for age and reported activity level.      Assessment/Plan:  End stage arthritis, right knee   The patient history, physical examination, clinical judgment of the provider and imaging studies are consistent with end stage degenerative joint disease of the right knee(s) and total knee arthroplasty is deemed medically necessary. The treatment options including medical management, injection therapy arthroscopy and arthroplasty were discussed at length. The risks and benefits of total knee arthroplasty were presented and reviewed. The risks due to aseptic loosening, infection, stiffness, patella tracking problems, thromboembolic complications and other imponderables were discussed. The patient acknowledged the explanation, agreed to proceed with the plan and consent was signed. Patient is being admitted for inpatient treatment for surgery, pain control, PT, OT, prophylactic antibiotics, VTE prophylaxis, progressive ambulation and ADL's and discharge planning. The  patient is planning to be discharged  home.     Patient's anticipated LOS is less than 2 midnights, meeting these requirements: - Younger than 32 - Lives within 1 hour of care - Has a competent adult at home to recover with post-op recover - NO history of  - Chronic pain requiring opiods  - Diabetes  - Coronary Artery Disease  - Heart failure  - Heart attack  - Stroke  - DVT/VTE  - Cardiac arrhythmia  - Respiratory Failure/COPD  - Renal failure  - Anemia  - Advanced Liver disease  Kim Pen, PA-C Orthopedic Surgery [facility] [place] [phone]

## 2023-11-22 NOTE — Anesthesia Procedure Notes (Addendum)
 Anesthesia Regional Block: Adductor canal block   Pre-Anesthetic Checklist: , timeout performed,  Correct Patient, Correct Site, Correct Laterality,  Correct Procedure, Correct Position, site marked,  Risks and benefits discussed,  Surgical consent,  Pre-op evaluation,  At surgeon's request and post-op pain management  Laterality: Right  Prep: chloraprep       Needles:  Injection technique: Single-shot  Needle Type: Echogenic Stimulator Needle     Needle Length: 5cm  Needle Gauge: 22     Additional Needles:   Procedures:, nerve stimulator,,, ultrasound used (permanent image in chart),,    Narrative:  Start time: 11/22/2023 2:30 PM End time: 11/22/2023 2:35 PM Injection made incrementally with aspirations every 5 mL.  Performed by: Personally  Anesthesiologist: Rhenda Cedars, MD  Additional Notes: Functioning IV was confirmed and monitors were applied.  A 50mm 22ga Arrow echogenic stimulator needle was used. Sterile prep and drape,hand hygiene and sterile gloves were used. Ultrasound guidance: relevant anatomy identified, needle position confirmed, local anesthetic spread visualized around nerve(s)., vascular puncture avoided.  Image printed for medical record. Negative aspiration and negative test dose prior to incremental administration of local anesthetic. The patient tolerated the procedure well.

## 2023-11-22 NOTE — Anesthesia Preprocedure Evaluation (Addendum)
 Anesthesia Evaluation  Patient identified by MRN, date of birth, ID band Patient awake    Reviewed: Allergy & Precautions, H&P , NPO status , Patient's Chart, lab work & pertinent test results  Airway Mallampati: II  TM Distance: >3 FB Neck ROM: Full    Dental no notable dental hx.    Pulmonary neg pulmonary ROS, shortness of breath, asthma , COPD, former smoker   Pulmonary exam normal breath sounds clear to auscultation       Cardiovascular Exercise Tolerance: Good negative cardio ROS Normal cardiovascular exam Rhythm:Regular Rate:Normal     Neuro/Psych  PSYCHIATRIC DISORDERS Anxiety Depression    negative neurological ROS  negative psych ROS   GI/Hepatic negative GI ROS, Neg liver ROS,,,  Endo/Other  negative endocrine ROSHypothyroidism    Renal/GU negative Renal ROS  negative genitourinary   Musculoskeletal negative musculoskeletal ROS (+) Arthritis ,    Abdominal   Peds negative pediatric ROS (+)  Hematology negative hematology ROS (+)   Anesthesia Other Findings   Reproductive/Obstetrics negative OB ROS                              Anesthesia Physical Anesthesia Plan  ASA: 3  Anesthesia Plan: Spinal, Regional and MAC   Post-op Pain Management: Minimal or no pain anticipated   Induction:   PONV Risk Score and Plan: 2 and Ondansetron, Propofol infusion and Treatment may vary due to age or medical condition  Airway Management Planned: Natural Airway, Nasal Cannula and Simple Face Mask  Additional Equipment:   Intra-op Plan:   Post-operative Plan:   Informed Consent: I have reviewed the patients History and Physical, chart, labs and discussed the procedure including the risks, benefits and alternatives for the proposed anesthesia with the patient or authorized representative who has indicated his/her understanding and acceptance.       Plan Discussed with:  Anesthesiologist  Anesthesia Plan Comments: (  )        Anesthesia Quick Evaluation

## 2023-11-23 ENCOUNTER — Encounter (HOSPITAL_COMMUNITY): Payer: Self-pay | Admitting: Orthopedic Surgery

## 2023-11-23 DIAGNOSIS — M1711 Unilateral primary osteoarthritis, right knee: Secondary | ICD-10-CM | POA: Diagnosis not present

## 2023-11-23 LAB — CBC
HCT: 31.1 % — ABNORMAL LOW (ref 36.0–46.0)
Hemoglobin: 9.8 g/dL — ABNORMAL LOW (ref 12.0–15.0)
MCH: 29.3 pg (ref 26.0–34.0)
MCHC: 31.5 g/dL (ref 30.0–36.0)
MCV: 92.8 fL (ref 80.0–100.0)
Platelets: 360 10*3/uL (ref 150–400)
RBC: 3.35 MIL/uL — ABNORMAL LOW (ref 3.87–5.11)
RDW: 13 % (ref 11.5–15.5)
WBC: 13.8 10*3/uL — ABNORMAL HIGH (ref 4.0–10.5)
nRBC: 0 % (ref 0.0–0.2)

## 2023-11-23 LAB — BASIC METABOLIC PANEL WITH GFR
Anion gap: 8 (ref 5–15)
BUN: 22 mg/dL (ref 8–23)
CO2: 23 mmol/L (ref 22–32)
Calcium: 8.9 mg/dL (ref 8.9–10.3)
Chloride: 104 mmol/L (ref 98–111)
Creatinine, Ser: 1.13 mg/dL — ABNORMAL HIGH (ref 0.44–1.00)
GFR, Estimated: 53 mL/min — ABNORMAL LOW (ref >60)
Glucose, Bld: 176 mg/dL — ABNORMAL HIGH (ref 70–99)
Potassium: 4.1 mmol/L (ref 3.5–5.1)
Sodium: 135 mmol/L (ref 135–145)

## 2023-11-23 MED ORDER — SENNA 8.6 MG PO TABS: 2.0000 | ORAL_TABLET | Freq: Every day | ORAL | 0 refills | Status: AC

## 2023-11-23 MED ORDER — POLYETHYLENE GLYCOL 3350 17 G PO PACK: 17.0000 g | PACK | Freq: Two times a day (BID) | ORAL | 0 refills | Status: AC

## 2023-11-23 MED ORDER — SODIUM CHLORIDE 0.9 % IV BOLUS
500.0000 mL | Freq: Once | INTRAVENOUS | Status: AC
  Administered 2023-11-23: 500 mL via INTRAVENOUS

## 2023-11-23 MED ORDER — ASPIRIN 81 MG PO CHEW: 81.0000 mg | CHEWABLE_TABLET | Freq: Two times a day (BID) | ORAL | 0 refills | Status: AC

## 2023-11-23 MED ORDER — OXYCODONE HCL 5 MG PO TABS: 5.0000 mg | ORAL_TABLET | ORAL | 0 refills | Status: AC | PRN

## 2023-11-23 MED ORDER — METHOCARBAMOL 500 MG PO TABS: 500.0000 mg | ORAL_TABLET | Freq: Four times a day (QID) | ORAL | 2 refills | Status: AC | PRN

## 2023-11-23 NOTE — Evaluation (Signed)
 Physical Therapy Evaluation Patient Details Name: [name] [name] MRN: [id number] DOB: 1955-12-11 Today's Date: 11/23/2023  History of Present Illness  Pt is 68 yo female admitted 11/23/23 for R TKA.  Pt with hx including but not limited to arthritis, skin CA, anxiety, COPD, emphysema, hypothyroidism, back sx  Clinical Impression  Pt is s/p TKA resulting in the deficits listed below (see PT Problem List). At baseline, pt is independent and used a cane at times.  She lives with her daughter and has 4-5 steps with bil rails to enter home.  She does need RW for home (has already been delivered in room).  Today, pt with no pain, excellent ROM and quad activation.  She ambulated 20' with RW and CGA.  Pt very motivated and eager to mobilize.  She does move quickly at times and needs cues for safety.  Would benefit from another session of PT to further educated on HEP and safety.  Expect that pt will be able to d/c home later today with family.  Pt will benefit from acute skilled PT to increase their independence and safety with mobility to allow discharge.          If plan is discharge home, recommend the following: A little help with walking and/or transfers;A little help with bathing/dressing/bathroom;Assistance with cooking/housework;Help with stairs or ramp for entrance   Can travel by private vehicle        Equipment Recommendations Rolling walker (2 wheels)  Recommendations for Other Services       Functional Status Assessment Patient has had a recent decline in their functional status and demonstrates the ability to make significant improvements in function in a reasonable and predictable amount of time.     Precautions / Restrictions Precautions Precautions: Fall;Knee Restrictions Weight Bearing Restrictions Per Provider Order: Yes RLE Weight Bearing Per Provider Order: Weight bearing as tolerated      Mobility  Bed Mobility Overal bed mobility: Needs Assistance Bed Mobility:  Supine to Sit     Supine to sit: Supervision          Transfers Overall transfer level: Needs assistance Equipment used: Rolling walker (2 wheels) Transfers: Sit to/from Stand Sit to Stand: Contact guard assist           General transfer comment: cues for handplacement and to wait for RW    Ambulation/Gait Ambulation/Gait assistance: Contact guard assist Gait Distance (Feet): 80 Feet Assistive device: Rolling walker (2 wheels) Gait Pattern/deviations: Step-through pattern, Decreased stride length, Decreased weight shift to right Gait velocity: decreased     General Gait Details: min cues for RW proximity; cues to take time with turns and stay inside RW  Stairs            Wheelchair Mobility     Tilt Bed    Modified Rankin (Stroke Patients Only)       Balance Overall balance assessment: Needs assistance Sitting-balance support: No upper extremity supported Sitting balance-Leahy Scale: Normal     Standing balance support: Bilateral upper extremity supported, No upper extremity supported Standing balance-Leahy Scale: Fair Standing balance comment: RW to ambualte but could static stand without support                             Pertinent Vitals/Pain Pain Assessment Pain Assessment: No/denies pain    Home Living Family/patient expects to be discharged to:: Private residence Living Arrangements: Children Available Help at Discharge: Family;Available 24 hours/day Type of  Home: Mobile home Home Access: Stairs to enter Entrance Stairs-Rails: Right;Left;Can reach both Entrance Stairs-Number of Steps: 4-5   Home Layout: One level Home Equipment: Cane - single point;Shower seat;BSC/3in1;Grab bars - tub/shower      Prior Function Prior Level of Function : Independent/Modified Independent             Mobility Comments: Could ambulate in community - was using a cane at times or dependent on grocery cart for longer distances ADLs  Comments: Pt independent adls; her and daughter share IADLs     Extremity/Trunk Assessment   Upper Extremity Assessment Upper Extremity Assessment: Overall WFL for tasks assessed    Lower Extremity Assessment Lower Extremity Assessment: LLE deficits/detail;RLE deficits/detail RLE Deficits / Details: Expected post op changes; ROM: knee 0 to 85 degrees; MMT ankle 5/5, knee and hip 3/5 not further tested LLE Deficits / Details: ROM WFL; MMT 5/5    Cervical / Trunk Assessment Cervical / Trunk Assessment: Normal  Communication        Cognition Arousal: Alert Behavior During Therapy: Impulsive   PT - Cognitive impairments: No apparent impairments                       PT - Cognition Comments: Pt eager to mobilize; some cues to take her time         Cueing       General Comments      Exercises Total Joint Exercises Ankle Circles/Pumps: AROM, Both, 10 reps, Supine Quad Sets: AROM, Both, 10 reps, Supine Long Arc Quad: AROM, Right, 10 reps, Seated Knee Flexion: AROM, Right, 10 reps, Seated   Assessment/Plan    PT Assessment Patient needs continued PT services  PT Problem List Decreased strength;Pain;Decreased range of motion;Decreased cognition;Decreased activity tolerance;Decreased knowledge of use of DME;Decreased balance;Decreased mobility;Decreased safety awareness       PT Treatment Interventions DME instruction;Therapeutic exercise;Gait training;Balance training;Stair training;Functional mobility training;Therapeutic activities;Patient/family education;Modalities    PT Goals (Current goals can be found in the Care Plan section)  Acute Rehab PT Goals Patient Stated Goal: return home PT Goal Formulation: With patient/family Time For Goal Achievement: 12/07/23 Potential to Achieve Goals: Good    Frequency 7X/week     Co-evaluation               AM-PAC PT "6 Clicks" Mobility  Outcome Measure Help needed turning from your back to your side  while in a flat bed without using bedrails?: A Little Help needed moving from lying on your back to sitting on the side of a flat bed without using bedrails?: A Little Help needed moving to and from a bed to a chair (including a wheelchair)?: A Little Help needed standing up from a chair using your arms (e.g., wheelchair or bedside chair)?: A Little Help needed to walk in hospital room?: A Little Help needed climbing 3-5 steps with a railing? : A Little 6 Click Score: 18    End of Session Equipment Utilized During Treatment: Gait belt Activity Tolerance: Patient tolerated treatment well Patient left: with chair alarm set;in chair;with call bell/phone within reach Nurse Communication: Mobility status PT Visit Diagnosis: Other abnormalities of gait and mobility (R26.89);Muscle weakness (generalized) (M62.81)    Time: 4098-1191 PT Time Calculation (min) (ACUTE ONLY): 19 min   Charges:   PT Evaluation $PT Eval Low Complexity: 1 Low   PT General Charges $$ ACUTE PT VISIT: 1 Visit         Najeeb Uptain, PT  Acute Rehab [facility] [phone]   Carolynn Citrin 11/23/2023, 11:56 AM

## 2023-11-23 NOTE — TOC Transition Note (Signed)
 Transition of Care [facility]) - Discharge Note   Patient Details  Name: Princessa Lesmeister MRN: [id number] Date of Birth: 1956-07-22  [facility] CM/SW Contact:  Delilah Fend, LCSW Phone Number: 11/23/2023, 10:29 AM   Clinical Narrative:     Met with pt and confirming need for RW.  No DME agency preference.  Order placed with [employer] for delivery to room this morning.  OPPT already arranged with [facility] (Sarah Ann).  No further TOC needs.  Final next level of care: OP Rehab Barriers to Discharge: No Barriers Identified   Patient Goals and CMS Choice Patient states their goals for this hospitalization and ongoing recovery are:: return home          Discharge Placement                       Discharge Plan and Services Additional resources added to the After Visit Summary for                  DME Arranged: Walker rolling DME Agency: [employer] Date DME Agency Contacted: 11/23/23 Time DME Agency Contacted: 0930 Representative spoke with at DME Agency: Kaitlyn            Social Drivers of Health (SDOH) Interventions SDOH Screenings   Food Insecurity: No Food Insecurity (11/22/2023)  Housing: Low Risk  (11/22/2023)  Transportation Needs: No Transportation Needs (11/22/2023)  Utilities: Not At Risk (11/22/2023)  Social Connections: Unknown (11/22/2023)  Tobacco Use: Medium Risk (11/22/2023)     Readmission Risk Interventions     No data to display

## 2023-11-23 NOTE — Plan of Care (Signed)
   Problem: Education: Goal: Knowledge of General Education information will improve Description: Including pain rating scale, medication(s)/side effects and non-pharmacologic comfort measures Outcome: Progressing   Problem: Activity: Goal: Risk for activity intolerance will decrease Outcome: Progressing   Problem: Pain Managment: Goal: General experience of comfort will improve and/or be controlled Outcome: Progressing

## 2023-11-23 NOTE — Progress Notes (Signed)
   Subjective: 1 Day Post-Op Procedure(s) (LRB): ARTHROPLASTY, KNEE, TOTAL (Right) Patient reports pain as mild.   Patient seen in rounds by Dr. Charlann Boxer. Patient is well, and has had no acute complaints or problems. No acute events overnight. Foley catheter removed. Patient has not been up with PT yet.  We will start therapy today.   Objective: Vital signs in last 24 hours: Temp:  [97.5 F (36.4 C)-98.7 F (37.1 C)] 98.7 F (37.1 C) (04/16 0549) Pulse Rate:  [81-101] 81 (04/16 0549) Resp:  [6-20] 18 (04/16 0549) BP: (88-118)/(51-72) 99/59 (04/16 0549) SpO2:  [96 %-100 %] 99 % (04/16 0549) Weight:  [70.3 kg] 70.3 kg (04/15 1308)  Intake/Output from previous day:  Intake/Output Summary (Last 24 hours) at 11/23/2023 0758 Last data filed at 11/23/2023 0600 Gross per 24 hour  Intake 3120 ml  Output 2210 ml  Net 910 ml     Intake/Output this shift: No intake/output data recorded.  Labs: Recent Labs    11/23/23 0329  HGB 9.8*   Recent Labs    11/23/23 0329  WBC 13.8*  RBC 3.35*  HCT 31.1*  PLT 360   Recent Labs    11/23/23 0329  NA 135  K 4.1  CL 104  CO2 23  BUN 22  CREATININE 1.13*  GLUCOSE 176*  CALCIUM 8.9   No results for input(s): "LABPT", "INR" in the last 72 hours.  Exam: General - Patient is Alert and Oriented Extremity - Neurologically intact Sensation intact distally Intact pulses distally Dorsiflexion/Plantar flexion intact Dressing - dressing C/D/I Motor Function - intact, moving foot and toes well on exam.   Past Medical History:  Diagnosis Date   Anxiety    Arthritis    Asthma    Cancer (HCC)    skin   COPD (chronic obstructive pulmonary disease) (HCC)    Depression    Dyspnea    Emphysema lung (HCC)    Hypothyroidism     Assessment/Plan: 1 Day Post-Op Procedure(s) (LRB): ARTHROPLASTY, KNEE, TOTAL (Right) Principal Problem:   S/P total knee arthroplasty, right  Estimated body mass index is 23.57 kg/m as calculated from the  following:   Height as of this encounter: 5\' 8"  (1.727 m).   Weight as of this encounter: 70.3 kg. Advance diet Up with therapy D/C IV fluids   Patient's anticipated LOS is less than 2 midnights, meeting these requirements: - Younger than 9 - Lives within 1 hour of care - Has a competent adult at home to recover with post-op recover - NO history of  - Chronic pain requiring opiods  - Diabetes  - Coronary Artery Disease  - Heart failure  - Heart attack  - Stroke  - DVT/VTE  - Cardiac arrhythmia  - Respiratory Failure/COPD  - Renal failure  - Anemia  - Advanced Liver disease     DVT Prophylaxis - Aspirin Weight bearing as tolerated.  Hgb stable at 9.8 this AM Cr. 1.13 without baseline value noted in EPIC - will d/c celebrex  BP soft this morning, will give a bolus   Plan is to go Home after hospital stay. Plan for discharge today following 1-2 sessions of PT as long as they are meeting their goals. Patient is scheduled for OPPT. Follow up in the office in 2 weeks.   Rosalene Billings, PA-C Orthopedic Surgery [phone] 11/23/2023, 7:58 AM

## 2023-11-23 NOTE — Progress Notes (Signed)
 Physical Therapy Treatment Patient Details Name: [name] [name] MRN: [id number] DOB: [birth year] Today's Date: 11/23/2023   History of Present Illness Pt is 68 yo female admitted 11/23/23 for R TKA.  Pt with hx including but not limited to arthritis, skin CA, anxiety, COPD, emphysema, hypothyroidism, back sx    PT Comments  Pt is POD # 1 and is progressing well.  Pt with good improvement in safety awareness and safe transfers/ambulation this afternoon.  She had no pain, excellent quad activation, ROM, and understanding of HEP.  She ambulated 120' with RW and performed stairs similar to home set up.  Daughter was present. Pt demonstrates safe gait & transfers in order to return home from PT perspective once discharged by MD.  While in hospital, will continue to benefit from PT for skilled therapy to advance mobility and exercises.       If plan is discharge home, recommend the following: A little help with walking and/or transfers;A little help with bathing/dressing/bathroom;Assistance with cooking/housework;Help with stairs or ramp for entrance   Can travel by private vehicle        Equipment Recommendations  Rolling walker (2 wheels)    Recommendations for Other Services       Precautions / Restrictions Precautions Precautions: Fall;Knee Restrictions Weight Bearing Restrictions Per Provider Order: Yes RLE Weight Bearing Per Provider Order: Weight bearing as tolerated     Mobility  Bed Mobility Overal bed mobility: Needs Assistance Bed Mobility: Supine to Sit     Supine to sit: Supervision          Transfers Overall transfer level: Needs assistance Equipment used: Rolling walker (2 wheels) Transfers: Sit to/from Stand Sit to Stand: Supervision           General transfer comment: good hand placement and backed all the way when returning to sit    Ambulation/Gait Ambulation/Gait assistance: Supervision Gait Distance (Feet): 120 Feet Assistive device: Rolling  walker (2 wheels) Gait Pattern/deviations: Step-through pattern Gait velocity: functional     General Gait Details: good improvement in RW safety (proximity and staying inside with turns)   Stairs Stairs: Yes Stairs assistance: Contact guard assist Stair Management: Forwards, Two rails Number of Stairs: 4 General stair comments: tolerated well; verbalized correct sequencing   Wheelchair Mobility     Tilt Bed    Modified Rankin (Stroke Patients Only)       Balance Overall balance assessment: Needs assistance Sitting-balance support: No upper extremity supported Sitting balance-Leahy Scale: Good     Standing balance support: Bilateral upper extremity supported, No upper extremity supported Standing balance-Leahy Scale: Fair Standing balance comment: RW to ambualte but could static stand without support                            Communication    Cognition Arousal: Alert Behavior During Therapy: WFL for tasks assessed/performed   PT - Cognitive impairments: No apparent impairments                       PT - Cognition Comments: motivated; improved safety this afternoong - still eager but safer/controlled turns, waited on RW, etc        Cueing    Exercises Total Joint Exercises Ankle Circles/Pumps: AROM, Both, 10 reps, Supine Quad Sets: AROM, Both, 10 reps, Supine Heel Slides: AROM, Right, 10 reps, Seated Hip ABduction/ADduction: AROM, Right, 10 reps, Seated Long Arc Quad: AROM, Right, 10 reps, Seated Knee  Flexion: AROM, Right, 10 reps, Seated Goniometric ROM: R knee 0 to 95 degrees without difficulty Other Exercises Other Exercises: educated on exercises to tolerance only and on AAROM techniques if needed    General Comments   Educated on safe ice use, no pivots, car transfers, resting with leg straight, and TED hose during day. Also, encouraged walking every 1-2 hours during day. Educated on HEP with focus on mobility the first weeks.  Discussed doing exercises within pain control and if pain increasing could decreased ROM, reps, and stop exercises as needed. Encouraged to perform quad sets and ankle pumps frequently for blood flow and to promote full knee extension.      Pertinent Vitals/Pain Pain Assessment Pain Assessment: No/denies pain    Home Living Family/patient expects to be discharged to:: Private residence Living Arrangements: Children Available Help at Discharge: Family;Available 24 hours/day Type of Home: Mobile home Home Access: Stairs to enter Entrance Stairs-Rails: Right;Left;Can reach both Entrance Stairs-Number of Steps: 4-5   Home Layout: One level Home Equipment: Cane - single point;Shower seat;BSC/3in1;Grab bars - tub/shower      Prior Function            PT Goals (current goals can now be found in the care plan section) Acute Rehab PT Goals Patient Stated Goal: return home PT Goal Formulation: With patient/family Time For Goal Achievement: 12/07/23 Potential to Achieve Goals: Good    Frequency    7X/week      PT Plan      Co-evaluation              AM-PAC PT "6 Clicks" Mobility   Outcome Measure  Help needed turning from your back to your side while in a flat bed without using bedrails?: A Little Help needed moving from lying on your back to sitting on the side of a flat bed without using bedrails?: A Little Help needed moving to and from a bed to a chair (including a wheelchair)?: A Little Help needed standing up from a chair using your arms (e.g., wheelchair or bedside chair)?: A Little Help needed to walk in hospital room?: A Little Help needed climbing 3-5 steps with a railing? : A Little 6 Click Score: 18    End of Session Equipment Utilized During Treatment: Gait belt Activity Tolerance: Patient tolerated treatment well Patient left: in chair;with call bell/phone within reach;with family/visitor present (alarm off - dtr helping pt dress) Nurse  Communication: Mobility status PT Visit Diagnosis: Other abnormalities of gait and mobility (R26.89);Muscle weakness (generalized) (M62.81)     Time: 4259-5638 PT Time Calculation (min) (ACUTE ONLY): 23 min  Charges:    $Gait Training: 8-22 mins $Therapeutic Exercise: 8-22 mins PT General Charges $$ ACUTE PT VISIT: 1 Visit                     Cyd Dowse, PT Acute Rehab Services [facility] [phone]    Carolynn Citrin 11/23/2023, 3:06 PM

## 2023-11-25 DIAGNOSIS — R269 Unspecified abnormalities of gait and mobility: Secondary | ICD-10-CM | POA: Diagnosis not present

## 2023-11-25 DIAGNOSIS — M25561 Pain in right knee: Secondary | ICD-10-CM | POA: Diagnosis not present

## 2023-11-25 DIAGNOSIS — M25661 Stiffness of right knee, not elsewhere classified: Secondary | ICD-10-CM | POA: Diagnosis not present

## 2023-11-28 DIAGNOSIS — R269 Unspecified abnormalities of gait and mobility: Secondary | ICD-10-CM | POA: Diagnosis not present

## 2023-11-28 DIAGNOSIS — M25561 Pain in right knee: Secondary | ICD-10-CM | POA: Diagnosis not present

## 2023-11-28 DIAGNOSIS — M25661 Stiffness of right knee, not elsewhere classified: Secondary | ICD-10-CM | POA: Diagnosis not present

## 2023-11-30 DIAGNOSIS — M25561 Pain in right knee: Secondary | ICD-10-CM | POA: Diagnosis not present

## 2023-11-30 DIAGNOSIS — M25661 Stiffness of right knee, not elsewhere classified: Secondary | ICD-10-CM | POA: Diagnosis not present

## 2023-11-30 DIAGNOSIS — R269 Unspecified abnormalities of gait and mobility: Secondary | ICD-10-CM | POA: Diagnosis not present

## 2023-12-02 DIAGNOSIS — R269 Unspecified abnormalities of gait and mobility: Secondary | ICD-10-CM | POA: Diagnosis not present

## 2023-12-02 DIAGNOSIS — M25661 Stiffness of right knee, not elsewhere classified: Secondary | ICD-10-CM | POA: Diagnosis not present

## 2023-12-02 DIAGNOSIS — M25561 Pain in right knee: Secondary | ICD-10-CM | POA: Diagnosis not present

## 2023-12-06 NOTE — Discharge Summary (Signed)
 Patient ID: [name]  [name] MRN: [id number] DOB/AGE: 1956/06/01 68 y.o.  Admit date: 11/22/2023 Discharge date: 11/23/2023  Admission Diagnoses:  Left knee osteoarthritis  Discharge Diagnoses:  Principal Problem:   S/P total knee arthroplasty, right   Past Medical History:  Diagnosis Date   Anxiety    Arthritis    Asthma    Cancer (HCC)    skin   COPD (chronic obstructive pulmonary disease) (HCC)    Depression    Dyspnea    Emphysema lung (HCC)    Hypothyroidism     Surgeries: Procedure(s): ARTHROPLASTY, KNEE, TOTAL on 11/22/2023   Consultants:   Discharged Condition: Improved  Hospital Course: [name]  [name] is an 68 y.o. female who was admitted 11/22/2023 for operative treatment ofS/P total knee arthroplasty, right. Patient has severe unremitting pain that affects sleep, daily activities, and work/hobbies. After pre-op clearance the patient was taken to the operating room on 11/22/2023 and underwent  Procedure(s): ARTHROPLASTY, KNEE, TOTAL.    Patient was given perioperative antibiotics:  Anti-infectives (From admission, onward)    Start     Dose/Rate Route Frequency Ordered Stop   11/23/23 0600  ceFAZolin  (ANCEF ) IVPB 2g/100 mL premix  Status:  Discontinued        2 g 200 mL/hr over 30 Minutes Intravenous On call to O.R. 11/22/23 1302 11/22/23 1725   11/22/23 2030  ceFAZolin  (ANCEF ) IVPB 2g/100 mL premix        2 g 200 mL/hr over 30 Minutes Intravenous Every 6 hours 11/22/23 1726 11/23/23 0600        Patient was given sequential compression devices, early ambulation, and chemoprophylaxis to prevent DVT. Patient worked with PT and was meeting their goals regarding safe ambulation and transfers.  Patient benefited maximally from hospital stay and there were no complications.    Recent vital signs: No data found.   Recent laboratory studies: No results for input(s): "WBC", "HGB", "HCT", "PLT", "NA", "K", "CL", "CO2", "BUN", "CREATININE", "GLUCOSE", "INR",  "CALCIUM" in the last 72 hours.  Invalid input(s): "PT", "2"   Discharge Medications:   Allergies as of 11/23/2023   No Known Allergies      Medication List     STOP taking these medications    diclofenac 75 MG EC tablet Commonly known as: VOLTAREN       TAKE these medications    ALPRAZolam 1 MG tablet Commonly known as: XANAX Take 1-2 mg by mouth at bedtime as needed for anxiety or sleep.   aspirin  81 MG chewable tablet Chew 1 tablet (81 mg total) by mouth 2 (two) times daily for 28 days.   ezetimibe  10 MG tablet Commonly known as: ZETIA  Take 10 mg by mouth daily.   FLUoxetine  20 MG capsule Commonly known as: PROZAC  Take 40 mg by mouth daily.   fluticasone -salmeterol 250-50 MCG/ACT Aepb Commonly known as: ADVAIR Inhale 1 puff into the lungs 2 (two) times daily.   levothyroxine  112 MCG tablet Commonly known as: SYNTHROID  Take 112 mcg by mouth daily before breakfast.   methocarbamol  500 MG tablet Commonly known as: ROBAXIN  Take 1 tablet (500 mg total) by mouth every 6 (six) hours as needed for muscle spasms.   methylphenidate 10 MG tablet Commonly known as: RITALIN Take 10 mg by mouth daily.   moxifloxacin 400 MG tablet Commonly known as: Avelox Take 1 tablet (400 mg total) by mouth daily at 8 pm.   oxyCODONE  5 MG immediate release tablet Commonly known as: Oxy IR/ROXICODONE  Take 1 tablet (5 mg total)  by mouth every 4 (four) hours as needed for severe pain (pain score 7-10).   polyethylene glycol 17 g packet Commonly known as: MIRALAX  / GLYCOLAX  Take 17 g by mouth 2 (two) times daily.   predniSONE 20 MG tablet Commonly known as: DELTASONE Take 1 tablet (20 mg total) by mouth 2 (two) times daily.   prenatal multivitamin Tabs tablet Take 1 tablet by mouth daily at 12 noon.   ProAir  HFA 108 (90 Base) MCG/ACT inhaler Generic drug: albuterol  Inhale 2 puffs into the lungs every 6 (six) hours as needed for wheezing or shortness of breath.   senna  8.6 MG Tabs tablet Commonly known as: SENOKOT Take 2 tablets (17.2 mg total) by mouth at bedtime for 14 days.   tiotropium 18 MCG inhalation capsule Commonly known as: SPIRIVA Place 18 mcg into inhaler and inhale daily.               Discharge Care Instructions  (From admission, onward)           Start     Ordered   11/23/23 0000  Change dressing       Comments: Maintain surgical dressing until follow up in the clinic. If the edges start to pull up, may reinforce with tape. If the dressing is no longer working, may remove and cover with gauze and tape, but must keep the area dry and clean.  Call with any questions or concerns.   11/23/23 0800            Diagnostic Studies: No results found.  Disposition: Discharge disposition: 01-Home or Self Care       Discharge Instructions     Call MD / Call 911   Complete by: As directed    If you experience chest pain or shortness of breath, CALL 911 and be transported to the hospital emergency room.  If you develope a fever above 101 F, pus (white drainage) or increased drainage or redness at the wound, or calf pain, call your surgeon's office.   Change dressing   Complete by: As directed    Maintain surgical dressing until follow up in the clinic. If the edges start to pull up, may reinforce with tape. If the dressing is no longer working, may remove and cover with gauze and tape, but must keep the area dry and clean.  Call with any questions or concerns.   Constipation Prevention   Complete by: As directed    Drink plenty of fluids.  Prune juice may be helpful.  You may use a stool softener, such as Colace (over the counter) 100 mg twice a day.  Use MiraLax  (over the counter) for constipation as needed.   Diet - low sodium heart healthy   Complete by: As directed    Increase activity slowly as tolerated   Complete by: As directed    Weight bearing as tolerated with assist device (walker, cane, etc) as directed, use it  as long as suggested by your surgeon or therapist, typically at least 4-6 weeks.   Post-operative opioid taper instructions:   Complete by: As directed    POST-OPERATIVE OPIOID TAPER INSTRUCTIONS: It is important to wean off of your opioid medication as soon as possible. If you do not need pain medication after your surgery it is ok to stop day one. Opioids include: Codeine, Hydrocodone(Norco, Vicodin), Oxycodone (Percocet, oxycontin ) and hydromorphone amongst others.  Long term and even short term use of opiods can cause: Increased pain response Dependence Constipation  Depression Respiratory depression And more.  Withdrawal symptoms can include Flu like symptoms Nausea, vomiting And more Techniques to manage these symptoms Hydrate well Eat regular healthy meals Stay active Use relaxation techniques(deep breathing, meditating, yoga) Do Not substitute Alcohol to help with tapering If you have been on opioids for less than two weeks and do not have pain than it is ok to stop all together.  Plan to wean off of opioids This plan should start within one week post op of your joint replacement. Maintain the same interval or time between taking each dose and first decrease the dose.  Cut the total daily intake of opioids by one tablet each day Next start to increase the time between doses. The last dose that should be eliminated is the evening dose.      TED hose   Complete by: As directed    Use stockings (TED hose) for 2 weeks on both leg(s).  You may remove them at night for sleeping.        Follow-up Information     Claiborne Crew, MD. Schedule an appointment as soon as possible for a visit in 2 week(s).   Specialty: Orthopedic Surgery Contact information: [address] [place] [address] [phone]                  Signed: Earnie Gola 12/06/2023, 7:13 AM

## 2023-12-08 DIAGNOSIS — M25561 Pain in right knee: Secondary | ICD-10-CM | POA: Diagnosis not present

## 2023-12-08 DIAGNOSIS — M25661 Stiffness of right knee, not elsewhere classified: Secondary | ICD-10-CM | POA: Diagnosis not present

## 2023-12-08 DIAGNOSIS — R269 Unspecified abnormalities of gait and mobility: Secondary | ICD-10-CM | POA: Diagnosis not present

## 2023-12-13 DIAGNOSIS — R269 Unspecified abnormalities of gait and mobility: Secondary | ICD-10-CM | POA: Diagnosis not present

## 2023-12-13 DIAGNOSIS — M25561 Pain in right knee: Secondary | ICD-10-CM | POA: Diagnosis not present

## 2023-12-13 DIAGNOSIS — M25661 Stiffness of right knee, not elsewhere classified: Secondary | ICD-10-CM | POA: Diagnosis not present

## 2023-12-15 DIAGNOSIS — R269 Unspecified abnormalities of gait and mobility: Secondary | ICD-10-CM | POA: Diagnosis not present

## 2023-12-15 DIAGNOSIS — M25561 Pain in right knee: Secondary | ICD-10-CM | POA: Diagnosis not present

## 2023-12-15 DIAGNOSIS — M25661 Stiffness of right knee, not elsewhere classified: Secondary | ICD-10-CM | POA: Diagnosis not present

## 2023-12-27 DIAGNOSIS — R269 Unspecified abnormalities of gait and mobility: Secondary | ICD-10-CM | POA: Diagnosis not present

## 2023-12-27 DIAGNOSIS — M25561 Pain in right knee: Secondary | ICD-10-CM | POA: Diagnosis not present

## 2023-12-27 DIAGNOSIS — M25661 Stiffness of right knee, not elsewhere classified: Secondary | ICD-10-CM | POA: Diagnosis not present

## 2024-01-03 DIAGNOSIS — M25661 Stiffness of right knee, not elsewhere classified: Secondary | ICD-10-CM | POA: Diagnosis not present

## 2024-01-03 DIAGNOSIS — R269 Unspecified abnormalities of gait and mobility: Secondary | ICD-10-CM | POA: Diagnosis not present

## 2024-01-03 DIAGNOSIS — M25561 Pain in right knee: Secondary | ICD-10-CM | POA: Diagnosis not present

## 2024-01-05 DIAGNOSIS — M25661 Stiffness of right knee, not elsewhere classified: Secondary | ICD-10-CM | POA: Diagnosis not present

## 2024-01-05 DIAGNOSIS — R269 Unspecified abnormalities of gait and mobility: Secondary | ICD-10-CM | POA: Diagnosis not present

## 2024-01-05 DIAGNOSIS — M25561 Pain in right knee: Secondary | ICD-10-CM | POA: Diagnosis not present

## 2024-01-13 DIAGNOSIS — Z96651 Presence of right artificial knee joint: Secondary | ICD-10-CM | POA: Diagnosis not present

## 2024-01-13 DIAGNOSIS — Z471 Aftercare following joint replacement surgery: Secondary | ICD-10-CM | POA: Diagnosis not present

## 2024-01-24 DIAGNOSIS — L570 Actinic keratosis: Secondary | ICD-10-CM | POA: Diagnosis not present

## 2024-01-24 DIAGNOSIS — D485 Neoplasm of uncertain behavior of skin: Secondary | ICD-10-CM | POA: Diagnosis not present

## 2024-02-21 DIAGNOSIS — L089 Local infection of the skin and subcutaneous tissue, unspecified: Secondary | ICD-10-CM | POA: Diagnosis not present

## 2024-02-23 DIAGNOSIS — L905 Scar conditions and fibrosis of skin: Secondary | ICD-10-CM | POA: Diagnosis not present

## 2024-02-23 DIAGNOSIS — C434 Malignant melanoma of scalp and neck: Secondary | ICD-10-CM | POA: Diagnosis not present

## 2024-03-08 DIAGNOSIS — C44722 Squamous cell carcinoma of skin of right lower limb, including hip: Secondary | ICD-10-CM | POA: Diagnosis not present

## 2024-03-09 DIAGNOSIS — R42 Dizziness and giddiness: Secondary | ICD-10-CM | POA: Diagnosis not present

## 2024-04-10 DIAGNOSIS — E039 Hypothyroidism, unspecified: Secondary | ICD-10-CM | POA: Diagnosis not present

## 2024-04-10 DIAGNOSIS — F32A Depression, unspecified: Secondary | ICD-10-CM | POA: Diagnosis not present

## 2024-04-11 LAB — LAB REPORT - SCANNED: EGFR: 68

## 2024-04-16 DIAGNOSIS — Z79890 Hormone replacement therapy (postmenopausal): Secondary | ICD-10-CM | POA: Diagnosis not present

## 2024-04-16 DIAGNOSIS — F909 Attention-deficit hyperactivity disorder, unspecified type: Secondary | ICD-10-CM | POA: Diagnosis not present

## 2024-04-16 DIAGNOSIS — J45909 Unspecified asthma, uncomplicated: Secondary | ICD-10-CM | POA: Diagnosis not present

## 2024-04-16 DIAGNOSIS — N183 Chronic kidney disease, stage 3 unspecified: Secondary | ICD-10-CM | POA: Diagnosis not present

## 2024-04-16 DIAGNOSIS — F32A Depression, unspecified: Secondary | ICD-10-CM | POA: Diagnosis not present

## 2024-04-16 DIAGNOSIS — M170 Bilateral primary osteoarthritis of knee: Secondary | ICD-10-CM | POA: Diagnosis not present

## 2024-04-16 DIAGNOSIS — L989 Disorder of the skin and subcutaneous tissue, unspecified: Secondary | ICD-10-CM | POA: Diagnosis not present

## 2024-04-16 DIAGNOSIS — E039 Hypothyroidism, unspecified: Secondary | ICD-10-CM | POA: Diagnosis not present

## 2024-04-16 DIAGNOSIS — F411 Generalized anxiety disorder: Secondary | ICD-10-CM | POA: Diagnosis not present

## 2024-04-16 DIAGNOSIS — E7801 Familial hypercholesterolemia: Secondary | ICD-10-CM | POA: Diagnosis not present

## 2024-04-16 DIAGNOSIS — Z79899 Other long term (current) drug therapy: Secondary | ICD-10-CM | POA: Diagnosis not present

## 2024-04-16 DIAGNOSIS — F329 Major depressive disorder, single episode, unspecified: Secondary | ICD-10-CM | POA: Diagnosis not present

## 2024-04-26 DIAGNOSIS — L570 Actinic keratosis: Secondary | ICD-10-CM | POA: Diagnosis not present

## 2024-05-02 ENCOUNTER — Other Ambulatory Visit (HOSPITAL_COMMUNITY): Payer: Self-pay

## 2024-05-02 ENCOUNTER — Ambulatory Visit (HOSPITAL_COMMUNITY)
Admission: RE | Admit: 2024-05-02 | Discharge: 2024-05-02 | Disposition: A | Discharge status: Home / Self Care | Source: Ambulatory Visit

## 2024-05-02 DIAGNOSIS — S3992XA Unspecified injury of lower back, initial encounter: Secondary | ICD-10-CM | POA: Insufficient documentation

## 2024-11-22 ENCOUNTER — Ambulatory Visit (HOSPITAL_COMMUNITY): Admit: 2024-11-22 | Admitting: Orthopedic Surgery

## 2024-11-22 SURGERY — ARTHROPLASTY, KNEE, TOTAL
Anesthesia: Spinal | Site: Knee | Laterality: Left
